# Patient Record
Sex: Male | Born: 1990 | Hispanic: No | Marital: Married | Smoking: Former smoker
Health system: Southern US, Community
[De-identification: ages and names within clinical notes are randomized; demographics above are authoritative.]

## PROBLEM LIST (undated history)

## (undated) DIAGNOSIS — B019 Varicella without complication: Secondary | ICD-10-CM

## (undated) DIAGNOSIS — F419 Anxiety disorder, unspecified: Secondary | ICD-10-CM

## (undated) DIAGNOSIS — E785 Hyperlipidemia, unspecified: Secondary | ICD-10-CM

## (undated) DIAGNOSIS — G43909 Migraine, unspecified, not intractable, without status migrainosus: Secondary | ICD-10-CM

## (undated) HISTORY — DX: Anxiety disorder, unspecified: F41.9

## (undated) HISTORY — PX: MULTIPLE TOOTH EXTRACTIONS: SHX2053

## (undated) HISTORY — DX: Migraine, unspecified, not intractable, without status migrainosus: G43.909

## (undated) HISTORY — DX: Hyperlipidemia, unspecified: E78.5

## (undated) HISTORY — DX: Varicella without complication: B01.9

---
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
## 2010-02-28 ENCOUNTER — Emergency Department (HOSPITAL_COMMUNITY)
Admission: EM | Admit: 2010-02-28 | Discharge: 2010-02-28 | Payer: Self-pay | Source: Home / Self Care | Admitting: Emergency Medicine

## 2010-04-27 LAB — URINALYSIS, ROUTINE W REFLEX MICROSCOPIC
Bilirubin Urine: NEGATIVE
Glucose, UA: NEGATIVE mg/dL
Hgb urine dipstick: NEGATIVE
Ketones, ur: NEGATIVE mg/dL
Nitrite: NEGATIVE
Protein, ur: NEGATIVE mg/dL
Specific Gravity, Urine: 1.02 (ref 1.005–1.030)
Urobilinogen, UA: 1 mg/dL (ref 0.0–1.0)
pH: 6.5 (ref 5.0–8.0)

## 2010-07-30 ENCOUNTER — Emergency Department (HOSPITAL_COMMUNITY)
Admission: EM | Admit: 2010-07-30 | Discharge: 2010-07-30 | Disposition: A | Discharge status: Home / Self Care | Payer: No Typology Code available for payment source | Attending: Emergency Medicine | Admitting: Emergency Medicine

## 2010-07-30 ENCOUNTER — Emergency Department (HOSPITAL_COMMUNITY): Payer: No Typology Code available for payment source

## 2010-07-30 DIAGNOSIS — M25519 Pain in unspecified shoulder: Secondary | ICD-10-CM | POA: Insufficient documentation

## 2010-07-30 DIAGNOSIS — Y92410 Unspecified street and highway as the place of occurrence of the external cause: Secondary | ICD-10-CM | POA: Insufficient documentation

## 2010-07-30 DIAGNOSIS — R071 Chest pain on breathing: Secondary | ICD-10-CM | POA: Insufficient documentation

## 2011-01-09 ENCOUNTER — Emergency Department (INDEPENDENT_AMBULATORY_CARE_PROVIDER_SITE_OTHER)
Admission: EM | Admit: 2011-01-09 | Discharge: 2011-01-09 | Disposition: A | Discharge status: Home / Self Care | Payer: Self-pay | Source: Home / Self Care | Attending: Emergency Medicine | Admitting: Emergency Medicine

## 2011-01-09 ENCOUNTER — Encounter: Payer: Self-pay | Admitting: Emergency Medicine

## 2011-01-09 DIAGNOSIS — J069 Acute upper respiratory infection, unspecified: Secondary | ICD-10-CM

## 2011-01-09 MED ORDER — GUAIFENESIN-CODEINE 100-10 MG/5ML PO SYRP: 5.0000 mL | ORAL_SOLUTION | Freq: Three times a day (TID) | ORAL | Status: AC | PRN

## 2011-01-09 NOTE — ED Notes (Signed)
C/o cough, general aches, sore throat, sore chest, no ear pain.  Denies diarrhea, one episode of vomiting several days ago.

## 2011-01-09 NOTE — ED Provider Notes (Signed)
History     CSN: [id number] Arrival date & time: 01/09/2011  5:14 PM   First MD Initiated Contact with Patient 01/09/11 1604      Chief Complaint  Patient presents with  . URI    (Consider location/radiation/quality/duration/timing/severity/associated sxs/prior treatment) HPI Comments: Coughing and congested, "my burns when I have a fever" "iot kind of hurts (points to both forearms)  Patient is a 20 y.o. male presenting with URI. The history is provided by the patient.  URI The primary symptoms include fever, fatigue, sore throat, cough, vomiting and myalgias. Primary symptoms do not include ear pain, wheezing, abdominal pain, arthralgias or rash. The current episode started 2 days ago. This is a new problem.  The onset of the illness is associated with exposure to sick contacts. Symptoms associated with the illness include chills, congestion and rhinorrhea. The illness is not associated with facial pain or sinus pressure. Risk factors for severe complications from URI include chronic cardiac disease, chronic kidney disease, immunosuppression and diabetes mellitus.    History reviewed. No pertinent past medical history.  History reviewed. No pertinent past surgical history.  History reviewed. No pertinent family history.  History  Substance Use Topics  . Smoking status: Former Smoker -- 1.0 packs/day    Quit date: 01/06/2011  . Smokeless tobacco: Not on file  . Alcohol Use: Yes      Review of Systems  Constitutional: Positive for fever, chills and fatigue.  HENT: Positive for congestion, sore throat and rhinorrhea. Negative for ear pain and sinus pressure.   Respiratory: Positive for cough. Negative for wheezing.   Gastrointestinal: Positive for vomiting. Negative for abdominal pain.  Musculoskeletal: Positive for myalgias. Negative for arthralgias.  Skin: Negative for rash.    Allergies  Review of patient's allergies indicates no known allergies.  Home Medications    Current Outpatient Rx  Name Route Sig Dispense Refill  . ACETAMINOPHEN 500 MG PO TABS Oral Take 500 mg by mouth every 6 (six) hours as needed.      Ronney Asters WARMING COLD MULTI-SYM PO Oral Take by mouth.        BP 131/68  Pulse 97  Temp(Src) 99.5 F (37.5 C) (Oral)  Resp 18  SpO2 97%  Physical Exam  Nursing note and vitals reviewed. Constitutional: He is oriented to person, place, and time. He appears well-developed and well-nourished. No distress.  HENT:  Head: Normocephalic.  Mouth/Throat: Oropharynx is clear and moist and mucous membranes are normal.  Eyes: Pupils are equal, round, and reactive to light. No scleral icterus.  Neck: Normal range of motion.  Cardiovascular: Normal rate.   Pulmonary/Chest: Effort normal and breath sounds normal. Not tachypneic. He has no decreased breath sounds. He has no wheezes. He has no rales. He exhibits no tenderness.  Musculoskeletal: Normal range of motion.  Lymphadenopathy:    He has no cervical adenopathy.  Neurological: He is alert and oriented to person, place, and time. He has normal strength. He displays no tremor. No cranial nerve deficit. He exhibits normal muscle tone. Coordination normal.  Skin: Skin is warm. No rash noted. No erythema.    ED Course  Procedures (including critical care time)  Labs Reviewed - No data to display No results found.   No diagnosis found.    MDM  URI- symptoms-         Jimmie Molly, MD 01/09/11 1744

## 2011-06-06 ENCOUNTER — Encounter (HOSPITAL_COMMUNITY): Payer: Self-pay

## 2011-06-06 ENCOUNTER — Emergency Department (INDEPENDENT_AMBULATORY_CARE_PROVIDER_SITE_OTHER)
Admission: EM | Admit: 2011-06-06 | Discharge: 2011-06-06 | Disposition: A | Discharge status: Home / Self Care | Payer: BC Managed Care – PPO | Source: Home / Self Care | Attending: Family Medicine | Admitting: Family Medicine

## 2011-06-06 DIAGNOSIS — K047 Periapical abscess without sinus: Secondary | ICD-10-CM

## 2011-06-06 MED ORDER — IBUPROFEN 800 MG PO TABS: 800.0000 mg | ORAL_TABLET | Freq: Three times a day (TID) | ORAL | Status: AC

## 2011-06-06 MED ORDER — CLINDAMYCIN HCL 150 MG PO CAPS: 150.0000 mg | ORAL_CAPSULE | Freq: Three times a day (TID) | ORAL | Status: AC

## 2011-06-06 NOTE — ED Provider Notes (Signed)
History     CSN: [id number]  Arrival date & time 06/06/11  1757   First MD Initiated Contact with Patient 06/06/11 1759      Chief Complaint  Patient presents with  . Oral Swelling    (Consider location/radiation/quality/duration/timing/severity/associated sxs/prior treatment) Patient is a 21 y.o. male presenting with tooth pain. The history is provided by the patient.  Dental PainThe primary symptoms include mouth pain. The symptoms began 2 days ago. The symptoms are worsening. The symptoms are new. The symptoms occur constantly.  Additional symptoms include: gum swelling and facial swelling.    History reviewed. No pertinent past medical history.  History reviewed. No pertinent past surgical history.  History reviewed. No pertinent family history.  History  Substance Use Topics  . Smoking status: Current Everyday Smoker -- 1.0 packs/day    Last Attempt to Quit: 01/06/2011  . Smokeless tobacco: Not on file  . Alcohol Use: Yes      Review of Systems  Constitutional: Negative.   HENT: Positive for facial swelling, neck pain and dental problem.   Hematological: Positive for adenopathy.    Allergies  Review of patient's allergies indicates no known allergies.  Home Medications   Current Outpatient Rx  Name Route Sig Dispense Refill  . ACETAMINOPHEN 500 MG PO TABS Oral Take 500 mg by mouth every 6 (six) hours as needed.      Marland Kitchen CLINDAMYCIN HCL 150 MG PO CAPS Oral Take 1 capsule (150 mg total) by mouth 3 (three) times daily. 21 capsule 0  . IBUPROFEN 800 MG PO TABS Oral Take 1 tablet (800 mg total) by mouth 3 (three) times daily. 30 tablet 0    BP 126/74  Pulse 78  Temp(Src) 98 F (36.7 C) (Oral)  Resp 14  SpO2 100%  Physical Exam  Nursing note and vitals reviewed. Constitutional: He is oriented to person, place, and time. He appears well-developed and well-nourished.  HENT:  Head: Normocephalic.  Right Ear: External ear normal.  Left Ear: External ear  normal.  Mouth/Throat:    Lymphadenopathy:    He has cervical adenopathy.  Neurological: He is alert and oriented to person, place, and time.  Skin: Skin is warm and dry.    ED Course  Procedures (including critical care time)  Labs Reviewed - No data to display No results found.   1. Dental abscess       MDM          Linna Hoff, MD 06/06/11 1929

## 2011-06-06 NOTE — Discharge Instructions (Signed)
Take medicine as prescribed, see your dentist as soon as possible °

## 2011-06-06 NOTE — ED Notes (Signed)
States he has a bad tooth (wisdom ) coming in , and it is making his face swell

## 2013-02-24 ENCOUNTER — Emergency Department (HOSPITAL_COMMUNITY)
Admission: EM | Admit: 2013-02-24 | Discharge: 2013-02-24 | Disposition: A | Discharge status: Home / Self Care | Payer: No Typology Code available for payment source | Attending: Emergency Medicine | Admitting: Emergency Medicine

## 2013-02-24 ENCOUNTER — Emergency Department (HOSPITAL_COMMUNITY): Payer: No Typology Code available for payment source

## 2013-02-24 ENCOUNTER — Encounter (HOSPITAL_COMMUNITY): Payer: Self-pay | Admitting: Emergency Medicine

## 2013-02-24 DIAGNOSIS — S39012A Strain of muscle, fascia and tendon of lower back, initial encounter: Secondary | ICD-10-CM

## 2013-02-24 DIAGNOSIS — Y92410 Unspecified street and highway as the place of occurrence of the external cause: Secondary | ICD-10-CM | POA: Insufficient documentation

## 2013-02-24 DIAGNOSIS — Z79899 Other long term (current) drug therapy: Secondary | ICD-10-CM | POA: Insufficient documentation

## 2013-02-24 DIAGNOSIS — S335XXA Sprain of ligaments of lumbar spine, initial encounter: Secondary | ICD-10-CM | POA: Insufficient documentation

## 2013-02-24 DIAGNOSIS — F17200 Nicotine dependence, unspecified, uncomplicated: Secondary | ICD-10-CM | POA: Insufficient documentation

## 2013-02-24 DIAGNOSIS — Y9389 Activity, other specified: Secondary | ICD-10-CM | POA: Insufficient documentation

## 2013-02-24 MED ORDER — NAPROXEN 375 MG PO TABS: 375.0000 mg | ORAL_TABLET | Freq: Two times a day (BID) | ORAL | Status: DC

## 2013-02-24 MED ORDER — METHOCARBAMOL 500 MG PO TABS: 500.0000 mg | ORAL_TABLET | Freq: Two times a day (BID) | ORAL | Status: DC
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
## 2013-02-24 NOTE — Discharge Instructions (Signed)
Please call and set-up an appointment with orthopedics and neurosurgery Please rest and stay hydrated Please as an outpatient obtain a MRI of your thoracic spine based on xray imaging There were no broken bones or dislocations noted to your mid-back and lower back xray  Please take medications as prescribed and on a full stomach  Please apply warm compressions, please massage with icy hot ointment Please rest and stay hydrated Please continue to monitor symptoms closely and if symptoms are to worsen or change (fever greater than 101, chills, chest pain, neck pain, numbness, tingling, inability to control urine or bowel movements, weakness, inability to walk, worsening or changes to pain pattern, fall, injury) please report back to emergency department immediately   Back Pain, Adult Back pain is very common. The pain often gets better over time. The cause of back pain is usually not dangerous. Most people can learn to manage their back pain on their own.  HOME CARE   Stay active. Start with short walks on flat ground if you can. Try to walk farther each day.  Do not sit, drive, or stand in one place for more than 30 minutes. Do not stay in bed.  Do not avoid exercise or work. Activity can help your back heal faster.  Be careful when you bend or lift an object. Bend at your knees, keep the object close to you, and do not twist.  Sleep on a firm mattress. Lie on your side, and bend your knees. If you lie on your back, put a pillow under your knees.  Only take medicines as told by your doctor.  Put ice on the injured area.  Put ice in a plastic bag.  Place a towel between your skin and the bag.  Leave the ice on for 15-20 minutes, 03-04 times a day for the first 2 to 3 days. After that, you can switch between ice and heat packs.  Ask your doctor about back exercises or massage.  Avoid feeling anxious or stressed. Find good ways to deal with stress, such as exercise. GET HELP RIGHT  AWAY IF:   Your pain does not go away with rest or medicine.  Your pain does not go away in 1 week.  You have new problems.  You do not feel well.  The pain spreads into your legs.  You cannot control when you poop (bowel movement) or pee (urinate).  Your arms or legs feel weak or lose feeling (numbness).  You feel sick to your stomach (nauseous) or throw up (vomit).  You have belly (abdominal) pain.  You feel like you may pass out (faint). MAKE SURE YOU:   Understand these instructions.  Will watch your condition.  Will get help right away if you are not doing well or get worse. Document Released: 07/18/2007 Document Revised: 04/23/2011 Document Reviewed: 06/19/2010 [facility] Patient Information 2014 [place], [place].  Back Exercises These exercises may help you when beginning to rehabilitate your injury. Your symptoms may resolve with or without further involvement from your physician, physical therapist or athletic trainer. While completing these exercises, remember:   Restoring tissue flexibility helps normal motion to return to the joints. This allows healthier, less painful movement and activity.  An effective stretch should be held for at least 30 seconds.  A stretch should never be painful. You should only feel a gentle lengthening or release in the stretched tissue. STRETCH  Extension, Prone on Elbows   Lie on your stomach on the floor, a bed  will be too soft. Place your palms about shoulder width apart and at the height of your head.  Place your elbows under your shoulders. If this is too painful, stack pillows under your chest.  Allow your body to relax so that your hips drop lower and make contact more completely with the floor.  Hold this position for __________ seconds.  Slowly return to lying flat on the floor. Repeat __________ times. Complete this exercise __________ times per day.  RANGE OF MOTION  Extension, Prone Press Ups   Lie on your stomach  on the floor, a bed will be too soft. Place your palms about shoulder width apart and at the height of your head.  Keeping your back as relaxed as possible, slowly straighten your elbows while keeping your hips on the floor. You may adjust the placement of your hands to maximize your comfort. As you gain motion, your hands will come more underneath your shoulders.  Hold this position __________ seconds.  Slowly return to lying flat on the floor. Repeat __________ times. Complete this exercise __________ times per day.  RANGE OF MOTION- Quadruped, Neutral Spine   Assume a hands and knees position on a firm surface. Keep your hands under your shoulders and your knees under your hips. You may place padding under your knees for comfort.  Drop your head and point your tail bone toward the ground below you. This will round out your low back like an angry cat. Hold this position for __________ seconds.  Slowly lift your head and release your tail bone so that your back sags into a large arch, like an old horse.  Hold this position for __________ seconds.  Repeat this until you feel limber in your low back.  Now, find your "sweet spot." This will be the most comfortable position somewhere between the two previous positions. This is your neutral spine. Once you have found this position, tense your stomach muscles to support your low back.  Hold this position for __________ seconds. Repeat __________ times. Complete this exercise __________ times per day.  STRETCH  Flexion, Single Knee to Chest   Lie on a firm bed or floor with both legs extended in front of you.  Keeping one leg in contact with the floor, bring your opposite knee to your chest. Hold your leg in place by either grabbing behind your thigh or at your knee.  Pull until you feel a gentle stretch in your low back. Hold __________ seconds.  Slowly release your grasp and repeat the exercise with the opposite side. Repeat __________  times. Complete this exercise __________ times per day.  STRETCH - Hamstrings, Standing  Stand or sit and extend your right / left leg, placing your foot on a chair or foot stool  Keeping a slight arch in your low back and your hips straight forward.  Lead with your chest and lean forward at the waist until you feel a gentle stretch in the back of your right / left knee or thigh. (When done correctly, this exercise requires leaning only a small distance.)  Hold this position for __________ seconds. Repeat __________ times. Complete this stretch __________ times per day. STRENGTHENING  Deep Abdominals, Pelvic Tilt   Lie on a firm bed or floor. Keeping your legs in front of you, bend your knees so they are both pointed toward the ceiling and your feet are flat on the floor.  Tense your lower abdominal muscles to press your low back into the  floor. This motion will rotate your pelvis so that your tail bone is scooping upwards rather than pointing at your feet or into the floor.  With a gentle tension and even breathing, hold this position for __________ seconds. Repeat __________ times. Complete this exercise __________ times per day.  STRENGTHENING  Abdominals, Crunches   Lie on a firm bed or floor. Keeping your legs in front of you, bend your knees so they are both pointed toward the ceiling and your feet are flat on the floor. Cross your arms over your chest.  Slightly tip your chin down without bending your neck.  Tense your abdominals and slowly lift your trunk high enough to just clear your shoulder blades. Lifting higher can put excessive stress on the low back and does not further strengthen your abdominal muscles.  Control your return to the starting position. Repeat __________ times. Complete this exercise __________ times per day.  STRENGTHENING  Quadruped, Opposite UE/LE Lift   Assume a hands and knees position on a firm surface. Keep your hands under your shoulders and your  knees under your hips. You may place padding under your knees for comfort.  Find your neutral spine and gently tense your abdominal muscles so that you can maintain this position. Your shoulders and hips should form a rectangle that is parallel with the floor and is not twisted.  Keeping your trunk steady, lift your right hand no higher than your shoulder and then your left leg no higher than your hip. Make sure you are not holding your breath. Hold this position __________ seconds.  Continuing to keep your abdominal muscles tense and your back steady, slowly return to your starting position. Repeat with the opposite arm and leg. Repeat __________ times. Complete this exercise __________ times per day. Document Released: 02/16/2005 Document Revised: 04/23/2011 Document Reviewed: 05/13/2008 [facility] Patient Information 2014 [place], [place].   Emergency Department Resource Guide 1) Find a Doctor and Pay Out of Pocket Although you won't have to find out who is covered by your insurance plan, it is a good idea to ask around and get recommendations. You will then need to call the office and see if the doctor you have chosen will accept you as a new patient and what types of options they offer for patients who are self-pay. Some doctors offer discounts or will set up payment plans for their patients who do not have insurance, but you will need to ask so you aren't surprised when you get to your appointment.  2) Contact Your Local Health Department Not all health departments have doctors that can see patients for sick visits, but many do, so it is worth a call to see if yours does. If you don't know where your local health department is, you can check in your phone book. The CDC also has a tool to help you locate your state's health department, and many state websites also have listings of all of their local health departments.  3) Find a Walk-in Clinic If your illness is not likely to be very severe or  complicated, you may want to try a walk in clinic. These are popping up all over the country in pharmacies, drugstores, and shopping centers. They're usually staffed by nurse practitioners or physician assistants that have been trained to treat common illnesses and complaints. They're usually fairly quick and inexpensive. However, if you have serious medical issues or chronic medical problems, these are probably not your best option.  No Primary Care Doctor: -  Call Health Connect at  [phone] - they can help you locate a primary care doctor that  accepts your insurance, provides certain services, etc. - Physician Referral Service- [phone]  Chronic Pain Problems: Organization         Address  Phone   Notes  [facility]  [phone] Patients need to be referred by their primary care doctor.   Medication Assistance: Organization         Address  Phone   Notes  [facility] Medication [facility] [address] [place], [place] [address] [phone] --Must be a resident of [facility] -- Must have NO insurance coverage whatsoever (no Medicaid/ Medicare, etc.) -- The pt. MUST have a primary care doctor that directs their care regularly and follows them in the community   [facility]  [phone]   [employer]  [phone]    Agencies that provide inexpensive medical care: Organization         Address  Phone   Notes  [facility]  [phone]   [facility]    [phone]   [facility] [address] [place], [place] [address] [phone]   [facility] [address], [place] [phone]   Planned Parenthood    [phone]   [facility]    [phone]   [facility]  [address], [place] Phone:  [phone], Fax:  [phone] Hours of Operation:  9 am - 6 pm, M-F.  Also accepts  Medicaid/Medicare and self-pay.  [facility]  [address], [place] Phone: [phone], Fax: [phone]. Hours of Operation:  8:30 am - 5:30 pm, M-F.  Also accepts Medicaid and self-pay.  [facility] [place] [address], [place] Phone: [phone]   [facility] [address] Natasha Bence [place], [place] [phone], Ext. [phone] Mondays & Thursdays: 7-9 AM.  First 15 patients are seen on a first come, first serve basis.    Medicaid-accepting [facility] Providers:  Organization         Address  Phone   Notes  [facility] [address], [place] [phone] Also accepts self-pay patients.  [facility] [address] Laurell Josephs [place], [place]  [phone]   [facility] [address], [place] [phone]   [facility] [address] [place] [phone]   Renaye Rakers [address], [place]   [phone] Only accepts [place] patients after they have their name applied to their card.   Self-Pay (no insurance) in [facility]:  Organization         Address  Phone   Notes  Sickle Cell Patients, [facility] [address] [place], [place] [phone]   [facility] [address] [place], [place] [phone]   [facility] [place]  [address], [place] [phone]   [facility]/Dr. Osei-Bonsu  [address] [place] or [address], [place] [phone] Phone number for both [place] and [place] locations is the same.  [facility] [address], [place] Otto [phone]   [facility] [phone]  [address] [place] or [address] [phone]   [facility] [address] [place], [place] [phone], phone; [phone], fax Sees patients 1st and 3rd Saturday of every month.  Must not qualify for public or private insurance (i.e. Medicaid, Medicare, [place] Health Choice, Veterans' Benefits)  Household income should be no more than 200% of the poverty level The clinic cannot treat you if you are pregnant or think you are pregnant  Sexually transmitted diseases are not treated at the clinic.    Dental Care: Organization         Address  Phone  Notes  [facility] Department of [facility] [address] [place], [place] [phone] Accepts children up to age 40 who are enrolled in [place] or [place] Health Choice; pregnant women with a Medicaid card; and children who have applied for Medicaid or [place] Health Choice, but were declined, whose parents can pay a reduced fee at time of service.  [facility]  [address], [place] [phone] Accepts children up to age 64 who are enrolled in [place] or [place] Health Choice; pregnant women with a Medicaid card; and children who have applied for Medicaid or [place] Health Choice, but were declined, whose parents can pay a reduced fee at time of service.  [facility]  [address] [place], [place] [phone] Patients are seen by appointment only. Walk-ins are not accepted. [facility] will see patients 72 years of age and older. Monday - Tuesday (8am-5pm) Most Wednesdays (8:30-5pm) $30 per visit, cash only  [facility] Adult Dental Access PRO[facility]  [address], [facility] [phone] Patients are seen by appointment only. Walk-ins are not accepted. [facility] will see patients 63 years of age and older. One Wednesday Evening (Monthly: Volunteer Based).  $30 per visit, cash only  [employer]  [phone] for adults; Children under age 42, call Graduate Pediatric Dentistry at [phone]. Children aged  39-14, please call [phone] to request a pediatric application.  Dental services are provided in all areas of dental care including fillings, crowns and bridges, complete and partial dentures, implants, gum treatment, root canals, and extractions. Preventive care is also provided. Treatment is provided to both adults and children. Patients are selected via a lottery and there is often a waiting list.   [facility] [address] [place]  [phone] [email or web address]   [facility] [address] [place], [place] [phone], Ext. [phone] Second and Fourth Thursday of each month, opens at 6:30 AM; Clinic ends at 9 AM.  Patients are seen on a first-come first-served basis, and a limited number are seen during each clinic.   [facility]  [address] Ether Griffins [place], [place] [phone]   Eligibility Requirements You must have lived in [place], [place], or [place] counties for at least the last three months.   You cannot be eligible for state or federal sponsored [place], including [employer], [place], or [employer].   You generally cannot be eligible for healthcare insurance through your employer.    How to apply: Eligibility screenings are held every Tuesday and Wednesday afternoon from 1:00 pm until 4:00 pm. You do not need an appointment for the interview!  [facility] [address], [place], [place] [phone]   [facility]  [phone]   [facility]  [phone]   [facility]  [phone]    Behavioral Health Resources in the Community: Intensive Outpatient Programs Organization         Address  Phone  Notes  [facility] [address], [place], [place] [phone]   [facility] [address], [place], [place] [phone]   [facility] [address],  [place], [place]  [phone]   [facility] [address],  [place], [place] [phone] or [phone]   Substance Abuse Resources Organization         Address  Phone  Notes  [facility]  [phone]   [facility]  [phone]   [facility]  [phone]   [facility]  [phone]   Residential & Outpatient Substance Abuse Program  [phone]   Psychological Services Organization         Address  Phone  Notes  [facility]  [phone]   [facility]  [phone]- [phone]   [facility] [address], [place] [phone] or [phone]    Mobile Crisis Teams Organization         Address  Phone  Notes  [facility], Mobile Crisis Care Unit  [phone]   [facility]  [address] [place], [place] [phone]   Doristine LocksSharon DeEsch [address] [place] [phone]    Self-Help/Support Groups Organization         Address  Phone             Notes  [facility] [place] - variety of support groups  336- [phone] Call for more information  Narcotics Anonymous (NA), [facility] [address], [place]   2 meetings at this location   Statisticianesidential Treatment Programs Organization         Address  Phone  Notes  [facility] [address],    [place]  [phone]   [facility]  [address], [place] [phone], [place], [place] [phone]   [facility] [address], [place] [phone] Admissions: 8am-3pm M-F  [facility] [address]    [place], [place] [phone]   [facility] [address], [place], [place] [phone]   [facility] [address]  [place], [place] [phone]   [facility] - Intensive Outpatient [address], [place], [place] [phone]   [facility]  (Addiction Recovery [facility] [address]  [place], [place] [phone] or [phone]   [facility] ([facility]) [address] [place], [place] [phone] Accepts Medicaid  [facility] [address]  [place] [phone] Substance Abuse/Addiction Treatment   [facility] Resources Organization         Address  Phone  Notes  [facility]  [phone]   Angie FavaJulie Brannon, PhD [address] [place], [place]   [phone] or [phone]   [facility]   [address] [place], [place] [phone]   [facility] [address], [place], [place] [phone] Insurance/Medicaid/sponsorship through [employer] [facility]  [facility] Families [address]  [place], [place] [phone] [place], [place] [phone]    Dr. Adele Schilder  [phone]   [facility] Dept. 1) [address] [place] 2) [place] 3)  [address], [place] [phone]  [phone]   [place] [phone] or [phone] (After Hours)

## 2013-02-27 NOTE — ED Provider Notes (Signed)
Medical screening examination/treatment/procedure(s) were performed by non-physician practitioner and as supervising physician I was immediately available for consultation/collaboration.  EKG Interpretation   None         Deairra Halleck T Delmo Matty, MD 02/27/13 0749 

## 2013-06-19 ENCOUNTER — Telehealth: Payer: Self-pay

## 2013-06-19 NOTE — Telephone Encounter (Signed)
Medication List and allergies:  Updated and Reviewed  90 day supply/mail order: n/a Local prescriptions:  [facility] [id number] [place] - [address]  Immunization due:  See below  A/P: Personal, family and PSH- Reviewed and Updated Flu- did not receive Tdap- unsure   NEW PATIENT [facility] PCP- [facility] Patient encouraged to have previous records faxed here.     To discuss with provider: Nothing at this time.

## 2013-06-22 ENCOUNTER — Ambulatory Visit: Payer: BC Managed Care – PPO | Admitting: Family Medicine

## 2013-06-22 DIAGNOSIS — Z0289 Encounter for other administrative examinations: Secondary | ICD-10-CM

## 2013-09-18 ENCOUNTER — Ambulatory Visit: Payer: BC Managed Care – PPO | Admitting: Family Medicine

## 2013-09-18 DIAGNOSIS — Z0289 Encounter for other administrative examinations: Secondary | ICD-10-CM

## 2015-02-14 ENCOUNTER — Encounter (HOSPITAL_COMMUNITY): Payer: Self-pay | Admitting: *Deleted

## 2015-02-14 ENCOUNTER — Emergency Department (HOSPITAL_COMMUNITY)
Admission: EM | Admit: 2015-02-14 | Discharge: 2015-02-14 | Disposition: A | Discharge status: Home / Self Care | Payer: BLUE CROSS/BLUE SHIELD | Source: Home / Self Care | Attending: Family Medicine | Admitting: Family Medicine

## 2015-02-14 DIAGNOSIS — N644 Mastodynia: Secondary | ICD-10-CM | POA: Diagnosis not present

## 2015-02-14 NOTE — Discharge Instructions (Signed)
Use heat, advil and reduce caffeine.

## 2015-02-14 NOTE — ED Notes (Signed)
Pt      Reports     Chest   Pain    X   1  Week         denys  Any  Shortness   Of  Breath         Skin  Is  Warm  And  Dry  Cap  Refill  Is  Intact     Pt  Sitting  Upright  On the  Exam   Table   speakingb in  Complete  sentances

## 2015-02-14 NOTE — ED Provider Notes (Signed)
CSN: [id number]     Arrival date & time 02/14/15  1735 History   First MD Initiated Contact with Patient 02/14/15 1759     Chief Complaint  Patient presents with  . Chest Pain   (Consider location/radiation/quality/duration/timing/severity/associated sxs/prior Treatment) Patient is a 25 y.o. male presenting with chest pain. The history is provided by the patient.  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 Relieved by:  None tried Worsened by:  Nothing tried Associated symptoms: no cough, no fever, no nausea, no palpitations and no shortness of breath   Associated symptoms comment:  Local soreness to breasts bilat.   Past Medical History  Diagnosis Date  . Anxiety    Past Surgical History  Procedure Laterality Date  . Multiple tooth extractions     Family History  Problem Relation Age of Onset  . Diabetes      mother side of family  . Heart disease Brother   . [name] Parkinson White syndrome Brother    Social History  Substance Use Topics  . Smoking status: Current Every Day Smoker -- 1.00 packs/day    Last Attempt to Quit: 01/06/2011  . Smokeless tobacco: None  . Alcohol Use: Yes    Review of Systems  Constitutional: Negative.  Negative for fever.  HENT: Negative.   Respiratory: Negative for cough, chest tightness and shortness of breath.   Cardiovascular: Positive for chest pain. Negative for palpitations and leg swelling.  Gastrointestinal: Negative for nausea.  All other systems reviewed and are negative.   Allergies  Shellfish allergy  Home Medications   Prior to Admission medications   Medication Sig Start Date End Date Taking? Authorizing Provider  ibuprofen (ADVIL,MOTRIN) 200 MG tablet Take 800 mg by mouth every 6 (six) hours as needed for headache or mild pain.    Historical Provider,  MD  methocarbamol (ROBAXIN) 500 MG tablet Take 1 tablet (500 mg total) by mouth 2 (two) times daily. 02/24/13   Marissa Sciacca, PA-C  naproxen (NAPROSYN) 375 MG tablet Take 1 tablet (375 mg total) by mouth 2 (two) times daily. 02/24/13   Marissa Sciacca, PA-C   Meds Ordered and Administered this Visit  Medications - No data to display  BP 158/86 mmHg  Pulse 78  Temp(Src) 98.6 F (37 C) (Oral)  Resp 18  SpO2 100% No data found.   Physical Exam  Constitutional: He is oriented to person, place, and time. He appears well-developed and well-nourished.  Neck: Normal range of motion. Neck supple.  Cardiovascular: Normal heart sounds and intact distal pulses.   Pulmonary/Chest: Effort normal and breath sounds normal. He exhibits tenderness.  bilat breast soreness.  Neurological: He is alert and oriented to person, place, and time.  Skin: Skin is warm and dry.  Nursing note and vitals reviewed.   ED Course  Procedures (including critical care time)  Labs Review Labs Reviewed - No data to display  Imaging Review No results found.   Visual Acuity Review  Right Eye Distance:   Left Eye Distance:   Bilateral Distance:    Right Eye Near:   Left Eye Near:    Bilateral Near:     ED ECG REPORT   Date: 02/14/2015  Rate: 92  Rhythm: normal sinus rhythm  QRS Axis:right  Intervals: normal  ST/T Wave abnormalities: nonspecific T wave changes  Conduction Disutrbances:none  Narrative Interpretation:   Old EKG Reviewed: none available  I have personally reviewed the EKG tracing and agree with the computerized printout as noted.     MDM   1. Mastalgia        Linna HoffJames D Kindl, MD 02/17/15 2038

## 2015-06-02 ENCOUNTER — Ambulatory Visit (INDEPENDENT_AMBULATORY_CARE_PROVIDER_SITE_OTHER): Payer: BLUE CROSS/BLUE SHIELD | Admitting: Family

## 2015-06-02 ENCOUNTER — Telehealth: Payer: Self-pay | Admitting: Family

## 2015-06-02 ENCOUNTER — Encounter: Payer: Self-pay | Admitting: Family

## 2015-06-02 VITALS — BP 140/82 | HR 82 | Temp 98.2°F | Resp 16 | Ht 72.0 in | Wt 307.0 lb

## 2015-06-02 DIAGNOSIS — N62 Hypertrophy of breast: Secondary | ICD-10-CM | POA: Insufficient documentation

## 2015-06-02 DIAGNOSIS — N644 Mastodynia: Secondary | ICD-10-CM

## 2015-06-02 MED ORDER — IBUPROFEN 800 MG PO TABS: 800.0000 mg | ORAL_TABLET | Freq: Three times a day (TID) | ORAL | Status: DC | PRN

## 2015-06-02 NOTE — Assessment & Plan Note (Signed)
Most likely related to gynecomastia with work up in progress. Start Ibuprofen as needed for discomfort. Follow up pending gynecomastia work up.

## 2015-06-02 NOTE — Progress Notes (Signed)
Subjective:    Patient ID: Dale Harrell, male    DOB: 09-Mar-1990, 25 y.o.   MRN: [id number]  Chief Complaint  Patient presents with  . Establish Care    has been having swelling in his chest where his breasts are, x2 months, really tender to touch    HPI:  Dale Harrell is a 25 y.o. male who  has a past medical history of Anxiety; Chicken pox; Hyperlipidemia; and Migraine. and presents today for an office visit to establish care.  1.) Mastalgia - This is a new problem. Associated symptom of pain located on his chest and is sensitive to the touch when pressed. Described as throbbing on occasion with some burning when he experiences heart. This has been going on for about 2 months. Previously seen in Urgent Care and diagnosed with mastalgia and prescribed anti-inflammatories which he could not find over the counter. Course of the symptoms has improved some. The symptoms generally come and go. No significant trauma that he can recall. Previous workup included an EKG which was normal.   Allergies  Allergen Reactions  . Shellfish Allergy Itching    Throat itching     Outpatient Prescriptions Prior to Visit  Medication Sig Dispense Refill  . ibuprofen (ADVIL,MOTRIN) 200 MG tablet Take 800 mg by mouth every 6 (six) hours as needed for headache or mild pain.    . methocarbamol (ROBAXIN) 500 MG tablet Take 1 tablet (500 mg total) by mouth 2 (two) times daily. 20 tablet 0  . naproxen (NAPROSYN) 375 MG tablet Take 1 tablet (375 mg total) by mouth 2 (two) times daily. 20 tablet 0   No facility-administered medications prior to visit.     Past Medical History  Diagnosis Date  . Anxiety   . Chicken pox   . Hyperlipidemia   . Migraine     Not since 25 years of age.     Past Surgical History  Procedure Laterality Date  . Multiple tooth extractions       Family History  Problem Relation Age of Onset  . Diabetes      mother side of family  . Heart disease Brother   .  [name] Parkinson White syndrome Brother   . Healthy Mother   . Healthy Father      Social History   Social History  . Marital Status: Single    Spouse Name: N/A  . Number of Children: 0  . 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 Smoking status: Current Every Day Smoker -- 1.00 packs/day    Types: Cigars    Last Attempt to Quit: 01/06/2011  . Smokeless tobacco: Never Used  . Alcohol Use: 12.6 oz/week    21 Cans of beer per week  . Drug Use: No  . Sexual Activity: Not on file   Other Topics Concern  . Not on file   Social History Narrative   Fun: Play the piano      Review of Systems  Constitutional: Negative for fever and chills.  Respiratory: Negative for chest tightness and shortness of breath.   Cardiovascular: Positive for chest pain. Negative for palpitations and leg swelling.      Objective:    BP 140/82 mmHg  Pulse 82  Temp(Src) 98.2 F (36.8 C) (Oral)  Resp 16  Ht 6' (1.829 m)  Wt 307 lb (139.254 kg)  BMI 41.63 kg/m2  SpO2  98% Nursing note and vital signs reviewed.  Physical Exam  Constitutional: He is oriented to person, place, and time. He appears well-developed and well-nourished. No distress.  Cardiovascular: Normal rate, regular rhythm, normal heart sounds and intact distal pulses.   Pulmonary/Chest: Effort normal and breath sounds normal.  Enlarged breast tissue that is tender to the touch with no signs of inflammation or nipple discharge.   Neurological: He is alert and oriented to person, place, and time.  Skin: Skin is warm and dry.  Psychiatric: He has a normal mood and affect. His behavior is normal. Judgment and thought content normal.       Assessment & Plan:   Problem List Items Addressed This Visit      Other   Mastalgia - Primary    Most likely related to gynecomastia with work up in progress. Start Ibuprofen as needed for discomfort. Follow up pending gynecomastia work  up.       Relevant Medications   ibuprofen (ADVIL,MOTRIN) 800 MG tablet   Other Relevant Orders   TSH   Hepatic function panel   Basic Metabolic Panel (BMET)   FSH   Estradiol   LH   Testosterone   Prolactin   Gynecomastia    Symptoms of mastalgia most likely related to gynecomastia. No masses noted upon physical exam with mild tenderness. Obtain TSH, testosterone, prolactin, denies and hormone, hepatic function panel, follicle-stimulating hormone, estradiol, and basic metabolic panel. Patient states he does have history of low testosterone and is concerned about his sperm count. Follow-up pending blood work results      Relevant Orders   TSH   Hepatic function panel   Basic Metabolic Panel (BMET)   FSH   Estradiol   LH   Testosterone   Prolactin       I have discontinued Mr. Zielinski's ibuprofen, methocarbamol, and naproxen. I am also having him start on ibuprofen.   Meds ordered this encounter  Medications  . ibuprofen (ADVIL,MOTRIN) 800 MG tablet    Sig: Take 1 tablet (800 mg total) by mouth every 8 (eight) hours as needed for moderate pain.    Dispense:  90 tablet    Refill:  0    Order Specific Question:  Supervising Provider    Answer:  Hillard DankerRAWFORD, ELIZABETH A [[id number]]     Follow-up: Return in about 1 month (around 07/02/2015).  Jeanine Luzalone, Gregory, FNP

## 2015-06-02 NOTE — Telephone Encounter (Signed)
Patient is requesting ibuprofen to be sent to [facility] on [address]

## 2015-06-02 NOTE — Assessment & Plan Note (Signed)
Symptoms of mastalgia most likely related to gynecomastia. No masses noted upon physical exam with mild tenderness. Obtain TSH, testosterone, prolactin, denies and hormone, hepatic function panel, follicle-stimulating hormone, estradiol, and basic metabolic panel. Patient states he does have history of low testosterone and is concerned about his sperm count. Follow-up pending blood work results

## 2015-06-02 NOTE — Patient Instructions (Signed)
Thank you for choosing [facility].  Summary/Instructions:  Your prescription(s) have been submitted to your pharmacy or been printed and provided for you. Please take as directed and contact our office if you believe you are having problem(s) with the medication(s) or have any questions.  Please stop by the lab on the basement level of the building for your blood work. Your results will be released to MyChart (or called to you) after review, usually within 72 hours after test completion. If any changes need to be made, you will be notified at that same time.  If your symptoms worsen or fail to improve, please contact our office for further instruction, or in case of emergency go directly to the emergency room at the closest medical facility.     

## 2015-06-02 NOTE — Progress Notes (Signed)
Pre visit review using our clinic review tool, if applicable. No additional management support is needed unless otherwise documented below in the visit note. 

## 2015-06-03 NOTE — Telephone Encounter (Signed)
Rx sent. Pt is aware.  °

## 2015-06-07 ENCOUNTER — Other Ambulatory Visit (INDEPENDENT_AMBULATORY_CARE_PROVIDER_SITE_OTHER): Payer: BLUE CROSS/BLUE SHIELD

## 2015-06-07 DIAGNOSIS — N62 Hypertrophy of breast: Secondary | ICD-10-CM | POA: Diagnosis not present

## 2015-06-07 DIAGNOSIS — N644 Mastodynia: Secondary | ICD-10-CM | POA: Diagnosis not present

## 2015-06-07 LAB — BASIC METABOLIC PANEL
BUN: 13 mg/dL (ref 6–23)
CO2: 29 mEq/L (ref 19–32)
Calcium: 9.7 mg/dL (ref 8.4–10.5)
Chloride: 101 mEq/L (ref 96–112)
Creatinine, Ser: 1.08 mg/dL (ref 0.40–1.50)
GFR: 106.98 mL/min (ref >60.00)
Glucose, Bld: 113 mg/dL — ABNORMAL HIGH (ref 70–99)
Potassium: 4.2 mEq/L (ref 3.5–5.1)
Sodium: 139 mEq/L (ref 135–145)

## 2015-06-07 LAB — HEPATIC FUNCTION PANEL
ALT: 43 U/L (ref 0–53)
AST: 23 U/L (ref 0–37)
Albumin: 4.3 g/dL (ref 3.5–5.2)
Alkaline Phosphatase: 71 U/L (ref 39–117)
Bilirubin, Direct: 0.2 mg/dL (ref 0.0–0.3)
Total Bilirubin: 0.6 mg/dL (ref 0.2–1.2)
Total Protein: 7.3 g/dL (ref 6.0–8.3)

## 2015-06-07 LAB — TSH: TSH: 2.16 u[IU]/mL (ref 0.35–4.50)

## 2015-06-07 LAB — LUTEINIZING HORMONE: LH: 6.25 m[IU]/mL (ref 1.50–9.30)

## 2015-06-07 LAB — TESTOSTERONE: TESTOSTERONE: 313.59 ng/dL (ref 300.00–890.00)

## 2015-06-07 LAB — FOLLICLE STIMULATING HORMONE: FSH: 8.1 m[IU]/mL (ref 1.4–18.1)

## 2015-06-08 LAB — PROLACTIN: Prolactin: 7.3 ng/mL (ref 2.0–18.0)

## 2015-06-08 LAB — ESTRADIOL: Estradiol: 52 pg/mL — ABNORMAL HIGH (ref <=39)

## 2015-06-09 ENCOUNTER — Other Ambulatory Visit (INDEPENDENT_AMBULATORY_CARE_PROVIDER_SITE_OTHER): Payer: BLUE CROSS/BLUE SHIELD

## 2015-06-09 ENCOUNTER — Encounter: Payer: Self-pay | Admitting: Family

## 2015-06-09 ENCOUNTER — Ambulatory Visit (INDEPENDENT_AMBULATORY_CARE_PROVIDER_SITE_OTHER): Payer: BLUE CROSS/BLUE SHIELD | Admitting: Family

## 2015-06-09 VITALS — BP 144/88 | HR 83 | Temp 98.2°F | Resp 16 | Ht 72.0 in | Wt 303.0 lb

## 2015-06-09 DIAGNOSIS — Z0000 Encounter for general adult medical examination without abnormal findings: Secondary | ICD-10-CM | POA: Diagnosis not present

## 2015-06-09 DIAGNOSIS — Z23 Encounter for immunization: Secondary | ICD-10-CM | POA: Diagnosis not present

## 2015-06-09 DIAGNOSIS — Z0001 Encounter for general adult medical examination with abnormal findings: Secondary | ICD-10-CM | POA: Insufficient documentation

## 2015-06-09 LAB — CBC
HEMATOCRIT: 41.8 % (ref 39.0–52.0)
Hemoglobin: 14.1 g/dL (ref 13.0–17.0)
MCHC: 33.6 g/dL (ref 30.0–36.0)
MCV: 80.6 fl (ref 78.0–100.0)
Platelets: 285 10*3/uL (ref 150.0–400.0)
RBC: 5.19 Mil/uL (ref 4.22–5.81)
RDW: 13.4 % (ref 11.5–15.5)
WBC: 7.9 10*3/uL (ref 4.0–10.5)

## 2015-06-09 LAB — HEMOGLOBIN A1C: HEMOGLOBIN A1C: 6.3 % (ref 4.6–6.5)

## 2015-06-09 LAB — LIPID PANEL
CHOLESTEROL: 148 mg/dL (ref 0–200)
HDL: 43.3 mg/dL (ref >39.00)
LDL Cholesterol: 86 mg/dL (ref 0–99)
NONHDL: 104.88
Total CHOL/HDL Ratio: 3
Triglycerides: 92 mg/dL (ref 0.0–149.0)
VLDL: 18.4 mg/dL (ref 0.0–40.0)

## 2015-06-09 NOTE — Assessment & Plan Note (Signed)
1) Anticipatory Guidance: Discussed importance of wearing a seatbelt while driving and not texting while driving; changing batteries in smoke detector at least once annually; wearing suntan lotion when outside; eating a balanced and moderate diet; getting physical activity at least 30 minutes per day.  2) Immunizations / Screenings / Labs:  Tetanus updated today. All other immunizations are up-to-date per recommendations. Obtain HIV antibody for HIV screening. Due for a dental exam encouraged to be completed independently. All other screenings are up-to-date per recommendations. Obtain CBC and Lipid profile.  Overall well exam with risk factors for cardiovascular disease including obesity. Encouraged weight loss of approximately 5-10% of current body weight through lifestyle changes.Recommend increasing physical activity to 30 minutes of moderate level activity daily. Encourage nutritional intake that focuses on nutrient dense foods and is moderate, varied, and balanced and is low in saturated fats and processed/sugary foods. Blood pressure slightly elevated today and we'll continue to monitor at next office visit. Continue other healthy lifestyle behaviors and choices. Follow-up prevention exam in 1 year. Follow-up office visit pending blood work and for previous conditions.

## 2015-06-09 NOTE — Progress Notes (Signed)
Subjective:    Patient ID: Dale Harrell, male    DOB: 06-16-1990, 25 y.o.   MRN: [id number]  Chief Complaint  Patient presents with  . CPE    Fasting,     HPI:  Dale Harrell is a 25 y.o. male who presents today for an annual wellness visit.   1) Health Maintenance -   Diet - Averages about 2-3 meals per day consisting pork, chicken, potatotes, vegetables and occasional fruit. Caffeine intake is minimal  Exercise - No structured exercise; Walking occasional   2) Preventative Exams / Immunizations:  Dental -- Due for exam  Vision -- Up to date   Health Maintenance  Topic Date Due  . HIV Screening  04/02/2005  . TETANUS/TDAP  04/02/2009  . INFLUENZA VACCINE  09/13/2015    Immunization History  Administered Date(s) Administered  . Tdap 06/09/2015   Allergies  Allergen Reactions  . Shellfish Allergy Itching    Throat itching     Outpatient Prescriptions Prior to Visit  Medication Sig Dispense Refill  . ibuprofen (ADVIL,MOTRIN) 800 MG tablet Take 1 tablet (800 mg total) by mouth every 8 (eight) hours as needed for moderate pain. 90 tablet 0   No facility-administered medications prior to visit.     Past Medical History  Diagnosis Date  . Anxiety   . Chicken pox   . Hyperlipidemia   . Migraine     Not since 25 years of age.     Past Surgical History  Procedure Laterality Date  . Multiple tooth extractions       Family History  Problem Relation Age of Onset  . Diabetes      mother side of family  . Heart disease Brother   . [name] Parkinson White syndrome Brother   . Healthy Mother   . Healthy Father      Social History   Social History  . Marital Status: Single    Spouse Name: N/A  . Number of Children: 0  . 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 Smoking status: Current Every Day Smoker -- 1.00 packs/day    Types: Cigars    Last Attempt to Quit: 01/06/2011  .  Smokeless tobacco: Never Used  . Alcohol Use: 12.6 oz/week    21 Cans of beer per week  . Drug Use: No  . Sexual Activity: Not on file   Other Topics Concern  . Not on file   Social History Narrative   Fun: Play the piano    Review of Systems  Constitutional: Denies fever, chills, fatigue, or significant weight gain/loss. HENT: Head: Denies headache or neck pain Ears: Denies changes in hearing, ringing in ears, earache, drainage Nose: Denies discharge, stuffiness, itching, nosebleed, sinus pain Throat: Denies sore throat, hoarseness, dry mouth, sores, thrush Eyes: Denies loss/changes in vision, pain, redness, blurry/double vision, flashing lights Cardiovascular: Denies chest pain/discomfort, tightness, palpitations, shortness of breath with activity, difficulty lying down, swelling, sudden awakening with shortness of breath Respiratory: Denies shortness of breath, cough, sputum production, wheezing Gastrointestinal: Denies dysphasia, heartburn, change in appetite, nausea, change in bowel habits, rectal bleeding, constipation, diarrhea, yellow skin or eyes Genitourinary: Denies frequency, urgency, burning/pain, blood in urine, incontinence, change in urinary strength. Musculoskeletal: Denies muscle/joint pain, stiffness, back pain, redness or swelling of joints, trauma Skin: Denies rashes, lumps, itching, dryness, color changes, or hair/nail changes Neurological: Denies dizziness, fainting, seizures, weakness, numbness,  tingling, tremor Psychiatric - Denies nervousness, stress, depression or memory loss Endocrine: Denies heat or cold intolerance, sweating, frequent urination, excessive thirst, changes in appetite Hematologic: Denies ease of bruising or bleeding     Objective:     BP 144/88 mmHg  Pulse 83  Temp(Src) 98.2 F (36.8 C) (Oral)  Resp 16  Ht 6' (1.829 m)  Wt 303 lb (137.44 kg)  BMI 41.09 kg/m2  SpO2 98% Nursing note and vital signs reviewed.  Physical Exam    Constitutional: He is oriented to person, place, and time. He appears well-developed and well-nourished.  HENT:  Head: Normocephalic.  Right Ear: Hearing, tympanic membrane, external ear and ear canal normal.  Left Ear: Hearing, tympanic membrane, external ear and ear canal normal.  Nose: Nose normal.  Mouth/Throat: Uvula is midline, oropharynx is clear and moist and mucous membranes are normal.  Eyes: Conjunctivae and EOM are normal. Pupils are equal, round, and reactive to light.  Neck: Neck supple. No JVD present. No tracheal deviation present. No thyromegaly present.  Cardiovascular: Normal rate, regular rhythm, normal heart sounds and intact distal pulses.   Pulmonary/Chest: Effort normal and breath sounds normal.  Abdominal: Soft. Bowel sounds are normal. He exhibits no distension and no mass. There is no tenderness. There is no rebound and no guarding.  Musculoskeletal: Normal range of motion. He exhibits no edema or tenderness.  Lymphadenopathy:    He has no cervical adenopathy.  Neurological: He is alert and oriented to person, place, and time. He has normal reflexes. No cranial nerve deficit. He exhibits normal muscle tone. Coordination normal.  Skin: Skin is warm and dry.  Psychiatric: He has a normal mood and affect. His behavior is normal. Judgment and thought content normal.       Assessment & Plan:   Problem List Items Addressed This Visit      Other   Routine adult health maintenance - Primary    1) Anticipatory Guidance: Discussed importance of wearing a seatbelt while driving and not texting while driving; changing batteries in smoke detector at least once annually; wearing suntan lotion when outside; eating a balanced and moderate diet; getting physical activity at least 30 minutes per day.  2) Immunizations / Screenings / Labs:  Tetanus updated today. All other immunizations are up-to-date per recommendations. Obtain HIV antibody for HIV screening. Due for a  dental exam encouraged to be completed independently. All other screenings are up-to-date per recommendations. Obtain CBC and Lipid profile.  Overall well exam with risk factors for cardiovascular disease including obesity. Encouraged weight loss of approximately 5-10% of current body weight through lifestyle changes.Recommend increasing physical activity to 30 minutes of moderate level activity daily. Encourage nutritional intake that focuses on nutrient dense foods and is moderate, varied, and balanced and is low in saturated fats and processed/sugary foods. Blood pressure slightly elevated today and we'll continue to monitor at next office visit. Continue other healthy lifestyle behaviors and choices. Follow-up prevention exam in 1 year. Follow-up office visit pending blood work and for previous conditions.        Relevant Orders   CBC   Lipid panel   Hemoglobin A1c   HIV antibody    Other Visit Diagnoses    Need for diphtheria-tetanus-pertussis (Tdap) vaccine, adult/adolescent        Relevant Orders    Tdap vaccine greater than or equal to 7yo IM (Completed)

## 2015-06-09 NOTE — Progress Notes (Signed)
Pre visit review using our clinic review tool, if applicable. No additional management support is needed unless otherwise documented below in the visit note. 

## 2015-06-09 NOTE — Patient Instructions (Addendum)
Thank you for choosing [facility].  Summary/Instructions:  Please stop by the lab on the basement level of the building for your blood work. Your results will be released to MyChart (or called to you) after review, usually within 72 hours after test completion. If any changes need to be made, you will be notified at that same time.  Health Maintenance, Male A healthy lifestyle and preventative care can promote health and wellness.  Maintain regular health, dental, and eye exams.  Eat a healthy diet. Foods like vegetables, fruits, whole grains, low-fat dairy products, and lean protein foods contain the nutrients you need and are low in calories. Decrease your intake of foods high in solid fats, added sugars, and salt. Get information about a proper diet from your health care provider, if necessary.  Regular physical exercise is one of the most important things you can do for your health. Most adults should get at least 150 minutes of moderate-intensity exercise (any activity that increases your heart rate and causes you to sweat) each week. In addition, most adults need muscle-strengthening exercises on 2 or more days a week.   Maintain a healthy weight. The body mass index (BMI) is a screening tool to identify possible weight problems. It provides an estimate of body fat based on height and weight. Your health care provider can find your BMI and can help you achieve or maintain a healthy weight. For males 20 years and older:  A BMI below 18.5 is considered underweight.  A BMI of 18.5 to 24.9 is normal.  A BMI of 25 to 29.9 is considered overweight.  A BMI of 30 and above is considered obese.  Maintain normal blood lipids and cholesterol by exercising and minimizing your intake of saturated fat. Eat a balanced diet with plenty of fruits and vegetables. Blood tests for lipids and cholesterol should begin at age 20 and be repeated every 5 years. If your lipid or cholesterol levels are  high, you are over age 50, or you are at high risk for heart disease, you may need your cholesterol levels checked more frequently.Ongoing high lipid and cholesterol levels should be treated with medicines if diet and exercise are not working.  If you smoke, find out from your health care provider how to quit. If you do not use tobacco, do not start.  Lung cancer screening is recommended for adults aged 55-80 years who are at high risk for developing lung cancer because of a history of smoking. A yearly low-dose CT scan of the lungs is recommended for people who have at least a 30-pack-year history of smoking and are current smokers or have quit within the past 15 years. A pack year of smoking is smoking an average of 1 pack of cigarettes a day for 1 year (for example, a 30-pack-year history of smoking could mean smoking 1 pack a day for 30 years or 2 packs a day for 15 years). Yearly screening should continue until the smoker has stopped smoking for at least 15 years. Yearly screening should be stopped for people who develop a health problem that would prevent them from having lung cancer treatment.  If you choose to drink alcohol, do not have more than 2 drinks per day. One drink is considered to be 12 oz (360 mL) of beer, 5 oz (150 mL) of wine, or 1.5 oz (45 mL) of liquor.  Avoid the use of street drugs. Do not share needles with anyone. Ask for help if you need   support or instructions about stopping the use of drugs.  High blood pressure causes heart disease and increases the risk of stroke. High blood pressure is more likely to develop in:  People who have blood pressure in the end of the normal range (100-139/85-89 mm Hg).  People who are overweight or obese.  People who are African American.  If you are 18-39 years of age, have your blood pressure checked every 3-5 years. If you are 40 years of age or older, have your blood pressure checked every year. You should have your blood pressure  measured twice--once when you are at a hospital or clinic, and once when you are not at a hospital or clinic. Record the average of the two measurements. To check your blood pressure when you are not at a hospital or clinic, you can use:  An automated blood pressure machine at a pharmacy.  A home blood pressure monitor.  If you are 45-79 years old, ask your health care provider if you should take aspirin to prevent heart disease.  Diabetes screening involves taking a blood sample to check your fasting blood sugar level. This should be done once every 3 years after age 45 if you are at a normal weight and without risk factors for diabetes. Testing should be considered at a younger age or be carried out more frequently if you are overweight and have at least 1 risk factor for diabetes.  Colorectal cancer can be detected and often prevented. Most routine colorectal cancer screening begins at the age of 50 and continues through age 75. However, your health care provider may recommend screening at an earlier age if you have risk factors for colon cancer. On a yearly basis, your health care provider may provide home test kits to check for hidden blood in the stool. A small camera at the end of a tube may be used to directly examine the colon (sigmoidoscopy or colonoscopy) to detect the earliest forms of colorectal cancer. Talk to your health care provider about this at age 50 when routine screening begins. A direct exam of the colon should be repeated every 5-10 years through age 75, unless early forms of precancerous polyps or small growths are found.  People who are at an increased risk for hepatitis B should be screened for this virus. You are considered at high risk for hepatitis B if:  You were born in a country where hepatitis B occurs often. Talk with your health care provider about which countries are considered high risk.  Your parents were born in a high-risk country and you have not received a  shot to protect against hepatitis B (hepatitis B vaccine).  You have HIV or AIDS.  You use needles to inject street drugs.  You live with, or have sex with, someone who has hepatitis B.  You are a man who has sex with other men (MSM).  You get hemodialysis treatment.  You take certain medicines for conditions like cancer, organ transplantation, and autoimmune conditions.  Hepatitis C blood testing is recommended for all people born from 1945 through 1965 and any individual with known risk factors for hepatitis C.  Healthy men should no longer receive prostate-specific antigen (PSA) blood tests as part of routine cancer screening. Talk to your health care provider about prostate cancer screening.  Testicular cancer screening is not recommended for adolescents or adult males who have no symptoms. Screening includes self-exam, a health care provider exam, and other screening tests. Consult with your   health care provider about any symptoms you have or any concerns you have about testicular cancer.  Practice safe sex. Use condoms and avoid high-risk sexual practices to reduce the spread of sexually transmitted infections (STIs).  You should be screened for STIs, including gonorrhea and chlamydia if:  You are sexually active and are younger than 24 years.  You are older than 24 years, and your health care provider tells you that you are at risk for this type of infection.  Your sexual activity has changed since you were last screened, and you are at an increased risk for chlamydia or gonorrhea. Ask your health care provider if you are at risk.  If you are at risk of being infected with HIV, it is recommended that you take a prescription medicine daily to prevent HIV infection. This is called pre-exposure prophylaxis (PrEP). You are considered at risk if:  You are a man who has sex with other men (MSM).  You are a heterosexual man who is sexually active with multiple partners.  You take  drugs by injection.  You are sexually active with a partner who has HIV.  Talk with your health care provider about whether you are at high risk of being infected with HIV. If you choose to begin PrEP, you should first be tested for HIV. You should then be tested every 3 months for as long as you are taking PrEP.  Use sunscreen. Apply sunscreen liberally and repeatedly throughout the day. You should seek shade when your shadow is shorter than you. Protect yourself by wearing long sleeves, pants, a wide-brimmed hat, and sunglasses year round whenever you are outdoors.  Tell your health care provider of new moles or changes in moles, especially if there is a change in shape or color. Also, tell your health care provider if a mole is larger than the size of a pencil eraser.  A one-time screening for abdominal aortic aneurysm (AAA) and surgical repair of large AAAs by ultrasound is recommended for men aged 65-75 years who are current or former smokers.  Stay current with your vaccines (immunizations).   This information is not intended to replace advice given to you by your health care provider. Make sure you discuss any questions you have with your health care provider.   Document Released: 07/28/2007 Document Revised: 02/19/2014 Document Reviewed: 06/26/2010 Elsevier Interactive Patient Education 2016 Elsevier Inc.  

## 2015-06-10 ENCOUNTER — Telehealth: Payer: Self-pay | Admitting: Family

## 2015-06-10 LAB — HIV ANTIBODY (ROUTINE TESTING W REFLEX): HIV: NONREACTIVE

## 2015-06-10 NOTE — Telephone Encounter (Signed)
Please inform patient that his HIV was negative. His cholesterol and white/red blood cells are also normal. His A1c is slightly elevated placing him in pre-diabetes. No medication is needed for this, but I would recommend lifestyle changes in nutrition as we discussed during his office visit. We will follow up once I hear back from endocrinology.

## 2015-06-12 ENCOUNTER — Telehealth: Payer: Self-pay | Admitting: Family

## 2015-06-12 DIAGNOSIS — N62 Hypertrophy of breast: Secondary | ICD-10-CM

## 2015-06-12 DIAGNOSIS — N644 Mastodynia: Secondary | ICD-10-CM

## 2015-06-12 NOTE — Telephone Encounter (Signed)
Please inform patient that I have sent a referral to endocrinology and they will call to schedule.

## 2015-06-13 NOTE — Telephone Encounter (Signed)
Pt aware of results 

## 2015-06-14 NOTE — Telephone Encounter (Signed)
Pt aware.

## 2015-06-30 ENCOUNTER — Ambulatory Visit: Payer: BLUE CROSS/BLUE SHIELD | Admitting: Family

## 2015-07-06 ENCOUNTER — Encounter: Payer: Self-pay | Admitting: Endocrinology

## 2015-07-06 ENCOUNTER — Ambulatory Visit (INDEPENDENT_AMBULATORY_CARE_PROVIDER_SITE_OTHER): Payer: BLUE CROSS/BLUE SHIELD | Admitting: Endocrinology

## 2015-07-06 VITALS — BP 126/84 | HR 72 | Temp 98.2°F | Ht 72.0 in | Wt 301.0 lb

## 2015-07-06 DIAGNOSIS — N62 Hypertrophy of breast: Secondary | ICD-10-CM | POA: Diagnosis not present

## 2015-07-06 LAB — HCG, QUANTITATIVE, PREGNANCY: QUANTITATIVE HCG: 0.03 m[IU]/mL

## 2015-07-06 MED ORDER — TAMOXIFEN CITRATE 10 MG PO TABS: 10.0000 mg | ORAL_TABLET | Freq: Every day | ORAL | Status: DC

## 2015-07-06 NOTE — Progress Notes (Signed)
Subjective:    Patient ID: Dale Harrell, male    DOB: September 06, 1990, 25 y.o.   MRN: [id number]  HPI Pt reports few months of moderate swelling of both breasts, but no assoc d/c.  He has no children.   Past Medical History  Diagnosis Date  . Anxiety   . Chicken pox   . Hyperlipidemia   . Migraine     Not since 25 years of age.    Past Surgical History  Procedure Laterality Date  . Multiple tooth extractions      Social History   Social History  . Marital Status: Single    Spouse Name: N/A  . Number of Children: 0  . 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 Smoking status: Current Every Day Smoker -- 1.00 packs/day    Types: Cigars    Last Attempt to Quit: 01/06/2011  . Smokeless tobacco: Never Used  . Alcohol Use: 12.6 oz/week    21 Cans of beer per week  . Drug Use: No  . Sexual Activity: Not on file   Other Topics Concern  . Not on file   Social History Narrative   Fun: Play the piano    Current Outpatient Prescriptions on File Prior to Visit  Medication Sig Dispense Refill  . ibuprofen (ADVIL,MOTRIN) 800 MG tablet Take 1 tablet (800 mg total) by mouth every 8 (eight) hours as needed for moderate pain. 90 tablet 0   No current facility-administered medications on file prior to visit.    Allergies  Allergen Reactions  . Shellfish Allergy Itching    Throat itching    Family History  Problem Relation Age of Onset  . Diabetes      mother side of family  . Heart disease Brother   . [name] Parkinson White syndrome Brother   . Healthy Mother   . Healthy Father   . Other Neg Hx     gynecomastia    BP 126/84 mmHg  Pulse 72  Temp(Src) 98.2 F (36.8 C) (Oral)  Ht 6' (1.829 m)  Wt 301 lb (136.533 kg)  BMI 40.81 kg/m2  SpO2 96%   Review of Systems denies depression, mastalgia, numbness, erectile dysfunction, decreased urinary stream, muscle weakness, fever, headache, easy bruising, sob,  rash, blurry vision, rhinorrhea, chest pain.  He has gained weight.     Objective:   Physical Exam VS: see vs page GEN: no distress HEAD: head: no deformity eyes: no periorbital swelling, no proptosis external nose and ears are normal mouth: no lesion seen NECK: supple, thyroid is not enlarged CHEST WALL: no deformity LUNGS: clear to auscultation BREASTS:  bilat gynecomastia and pseudogynecomastia CV: reg rate and rhythm, no murmur ABD: abdomen is soft, nontender.  no hepatosplenomegaly.  not distended.  no hernia GENITALIA:  Normal male.   MUSCULOSKELETAL: muscle bulk and strength are grossly normal.  no obvious joint swelling.  gait is normal and steady EXTEMITIES: no deformity.  no edema PULSES: no carotid bruit NEURO:  cn 2-12 grossly intact.   readily moves all 4's.  sensation is intact to touch on all 4's.  SKIN:  Normal texture and temperature.  No rash or suspicious lesion is visible.  Normal hair distribution.  NODES:  None palpable at the neck PSYCH: alert, well-oriented.  Does not appear anxious nor depressed.   Lab Results  Component Value Date   TSH 2.16 06/07/2015   Lab  Results  Component Value Date   TESTOSTERONE 313.59 06/07/2015   HCG=normal  E2=52  Testicular US (2011): Left varicocele and left testicular cyst.    I have reviewed outside records, and summarized: Pt was noted to have gynecomastia, and referred here.  He was noted to have some mastalgia then.       Assessment & Plan:  Obesity: severe.   Gynecomastia, new, prob due to the obesity.     Patient is advised the following: Patient Instructions  blood tests are requested for you today.  We'll let you know about the results.  Based on the results, I think I can prescribe for you a pill to shrink the swelling.  Weight loss also helps.  Please come back for a follow-up appointment in 4 months.    addendum: i have sent a prescription to your pharmacy, for the breast swelling.

## 2015-07-06 NOTE — Patient Instructions (Addendum)
blood tests are requested for you today.  We'll let you know about the results.  Based on the results, I think I can prescribe for you a pill to shrink the swelling.  Weight loss also helps.  Please come back for a follow-up appointment in 4 months.

## 2015-07-07 ENCOUNTER — Encounter: Payer: Self-pay | Admitting: Family

## 2015-07-07 ENCOUNTER — Ambulatory Visit (INDEPENDENT_AMBULATORY_CARE_PROVIDER_SITE_OTHER): Payer: BLUE CROSS/BLUE SHIELD | Admitting: Family

## 2015-07-07 VITALS — BP 120/80 | HR 94 | Temp 97.7°F | Resp 16 | Ht 72.0 in | Wt 299.0 lb

## 2015-07-07 DIAGNOSIS — N62 Hypertrophy of breast: Secondary | ICD-10-CM | POA: Diagnosis not present

## 2015-07-07 NOTE — Assessment & Plan Note (Signed)
Recommend weight loss of 5-10% of current body weight through nutrition and physical activity. Recommend increasing physical activity to 30 minutes of moderate level activity daily. Encourage nutritional intake that focuses on nutrient dense foods and is moderate, varied, and balanced and is low in saturated fats and processed/sugary foods. Recommend tracking calories with MyFitnessPal. Continue to monitor.

## 2015-07-07 NOTE — Progress Notes (Signed)
Subjective:    Patient ID: Dale Harrell, male    DOB: [birth year], 25 y.o.   MRN: [id number]  Chief Complaint  Patient presents with  . Follow-up    HPI:  Dale Harrell is a 25 y.o. male who  has a past medical history of Anxiety; Chicken pox; Hyperlipidemia; and Migraine. and presents today for a follow up office visit.   1.) Gynecomastia - Previously diagnosed with gynecomastia with work up showing increased levels of estradiol. TSH and testosterone were within the normal limits. Discussed and referred to endocrinology for assistance with management. Appointment completed with recommendation for continued treatment with the addition of Tamoxifen to reduce the amount of estrogen and improve testosterone. Continues to experience the associated symptom of gynecomastia with significant improvement in mastalgia with only occasional discomfort and no further pain.     Allergies  Allergen Reactions  . Shellfish Allergy Itching    Throat itching     Current Outpatient Prescriptions on File Prior to Visit  Medication Sig Dispense Refill  . ibuprofen (ADVIL,MOTRIN) 800 MG tablet Take 1 tablet (800 mg total) by mouth every 8 (eight) hours as needed for moderate pain. 90 tablet 0  . tamoxifen (NOLVADEX) 10 MG tablet Take 1 tablet (10 mg total) by mouth daily. 30 tablet 11   No current facility-administered medications on file prior to visit.    Review of Systems  Constitutional: Negative for fever and chills.  Respiratory: Negative for chest tightness and shortness of breath.   Cardiovascular: Negative for chest pain, palpitations and leg swelling.  Neurological: Negative for weakness and headaches.      Objective:    BP 120/80 mmHg  Pulse 94  Temp(Src) 97.7 F (36.5 C) (Oral)  Resp 16  Ht 6' (1.829 m)  Wt 299 lb (135.626 kg)  BMI 40.54 kg/m2  SpO2 96% Nursing note and vital signs reviewed.  Physical Exam  Constitutional: He is oriented to person, place, and  time. He appears well-developed and well-nourished. No distress.  Cardiovascular: Normal rate, regular rhythm, normal heart sounds and intact distal pulses.   Pulmonary/Chest: Effort normal and breath sounds normal. Right breast exhibits no mass, no nipple discharge, no skin change and no tenderness. Left breast exhibits no mass, no nipple discharge, no skin change and no tenderness.  Neurological: He is alert and oriented to person, place, and time.  Skin: Skin is warm and dry.  Psychiatric: He has a normal mood and affect. His behavior is normal. Judgment and thought content normal.       Assessment & Plan:   Problem List Items Addressed This Visit      Other   Gynecomastia - Primary    Gynecomastia with addition of Tamoxifen to help reduce estrogen and increase testosterone. Recommend resistance training to assist with toning of musculature and reduce weight. Continue follow up and changes per endocrinology.      Morbid obesity (HCC)    Recommend weight loss of 5-10% of current body weight through nutrition and physical activity. Recommend increasing physical activity to 30 minutes of moderate level activity daily. Encourage nutritional intake that focuses on nutrient dense foods and is moderate, varied, and balanced and is low in saturated fats and processed/sugary foods. Recommend tracking calories with MyFitnessPal. Continue to monitor.           I am having Mr. Dale Harrell maintain his ibuprofen and tamoxifen.   Follow-up: Return in about 3 months (around 10/07/2015).  Dale Harrell, Dale Ambroise, FNP

## 2015-07-07 NOTE — Assessment & Plan Note (Signed)
Gynecomastia with addition of Tamoxifen to help reduce estrogen and increase testosterone. Recommend resistance training to assist with toning of musculature and reduce weight. Continue follow up and changes per endocrinology.

## 2015-07-07 NOTE — Patient Instructions (Addendum)
Thank you for choosing [facility].  Summary/Instructions:  Please continue to take your medication as prescribed.   If your symptoms worsen or fail to improve, please contact our office for further instruction, or in case of emergency go directly to the emergency room at the closest medical facility.     

## 2015-07-07 NOTE — Progress Notes (Signed)
Pre visit review using our clinic review tool, if applicable. No additional management support is needed unless otherwise documented below in the visit note. 

## 2015-07-14 ENCOUNTER — Other Ambulatory Visit: Payer: Self-pay | Admitting: *Deleted

## 2015-07-14 DIAGNOSIS — N644 Mastodynia: Secondary | ICD-10-CM

## 2015-07-14 MED ORDER — IBUPROFEN 800 MG PO TABS: 800.0000 mg | ORAL_TABLET | Freq: Three times a day (TID) | ORAL | Status: DC | PRN

## 2015-07-14 NOTE — Telephone Encounter (Signed)
Left msg on triage stating needing to know which pharmacy his medication went. Called pt back inform him per chart Dr. Marylyn Ishiharaelliosn sent his Tamoxifen to [facility] on [address]. He is also stating he need refill on Ibuprofen. Inform will send to pharmacy...Raechel Chute/lmb

## 2015-11-07 ENCOUNTER — Ambulatory Visit: Payer: BLUE CROSS/BLUE SHIELD | Admitting: Endocrinology

## 2015-11-08 DIAGNOSIS — Z0289 Encounter for other administrative examinations: Secondary | ICD-10-CM

## 2016-03-07 ENCOUNTER — Telehealth: Payer: Self-pay | Admitting: Family

## 2016-03-07 NOTE — Telephone Encounter (Signed)
PLEASE NOTE: All timestamps contained within this report are represented as [place] Standard Time. CONFIDENTIALTY NOTICE: This fax transmission is intended only for the addressee. It contains information that is legally privileged, confidential or otherwise protected from use or disclosure. If you are not the intended recipient, you are strictly prohibited from reviewing, disclosing, copying using or disseminating any of this information or taking any action in reliance on or regarding this information. If you have received this fax in error, please notify us immediately by telephone so that we can arrange for its return to us. Phone: [phone], Toll-Free: [phone], Fax: [phone] Page: 1 of 1 Call Id: [id number] [facility] - Client TELEPHONE ADVICE RECORD [facility] Patient Name: Dale StackCHRISTOPHER Arnall DOB: 1990/07/22 Initial Comment States having chest pain. Pain when moves body. Does not know who dr is. Nurse Assessment Nurse: Elijah Birkaldwell, RN, Lynda Date/Time (Eastern Time): 03/07/2016 5:04:34 PM Confirm and document reason for call. If symptomatic, describe symptoms. ---States having central chest pain. He also has pain when moves body. Does not know who Dr. is. Symptoms started after cold & cough which he had for several weeks. No fever. Initially thought it was a pulled muscle. Chest wall is tender to touch. Does the patient have any new or worsening symptoms? ---Yes Will a triage be completed? ---Yes Related visit to physician within the last 2 weeks? ---No Does the PT have any chronic conditions? (i.e. diabetes, asthma, etc.) ---No Is this a behavioral health or substance abuse call? ---No Guidelines Guideline Title Affirmed Question Affirmed Notes Chest Pain [1] Chest pain lasting <= 5 minutes AND [2] NO chest pain or cardiac symptoms now (Exceptions: pains lasting a few seconds) Final Disposition User See Physician within 24 Hours  Ivanhoealdwell, RN, Hilton HotelsLynda Comments Caller states if he coughs hard/deep, he will get the pain in the chest as well. States his cough & cold symptoms have resolved. Rates his chest tenderness a 7 on pain scale, states it is brief when it comes on. Referrals REFERRED TO PCP OFFICE Disagree/Comply: Comply

## 2016-03-08 ENCOUNTER — Ambulatory Visit (INDEPENDENT_AMBULATORY_CARE_PROVIDER_SITE_OTHER): Payer: BLUE CROSS/BLUE SHIELD | Admitting: Internal Medicine

## 2016-03-08 ENCOUNTER — Encounter: Payer: Self-pay | Admitting: Internal Medicine

## 2016-03-08 VITALS — BP 130/74 | HR 84 | Resp 20 | Wt 309.0 lb

## 2016-03-08 DIAGNOSIS — N644 Mastodynia: Secondary | ICD-10-CM

## 2016-03-08 DIAGNOSIS — R0789 Other chest pain: Secondary | ICD-10-CM | POA: Diagnosis not present

## 2016-03-08 MED ORDER — IBUPROFEN 800 MG PO TABS: 800.0000 mg | ORAL_TABLET | Freq: Three times a day (TID) | ORAL | 0 refills | Status: DC | PRN

## 2016-03-08 NOTE — Assessment & Plan Note (Signed)
Atypical, etiology unclear, ecg reviewed with pt - no evidence for ACS, suspect primarily MSK - for ibuprofen refill asd, also for cxr tomorrow (is closed tonight),  to f/u any worsening symptoms or concerns

## 2016-03-08 NOTE — Progress Notes (Signed)
   Subjective:    Patient ID: Dale DickerChristopher M Harrell, male    DOB: 1990/12/16, 26 y.o.   MRN: [id number]  HPI  Here with 3 wks onset SSCP, sharp without radiation, sob, diaphoresis, n/v, palp or dizziness.  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  Denies ST, cough, fever or wheezing.  Denies worsening reflux, abd pain, dysphagia, n/v, bowel change or blood.  Pt denies new neurological symptoms such as new headache, or facial or extremity weakness or numbness  Pt denies polydipsia, polyuria  No other new history Past Medical History:  Diagnosis Date  . Anxiety   . Chicken pox   . Hyperlipidemia   . Migraine    Not since 26 years of age.   Past Surgical History:  Procedure Laterality Date  . MULTIPLE TOOTH EXTRACTIONS      reports that he has been smoking Cigars.  He has been smoking about 1.00 pack per day. He has never used smokeless tobacco. He reports that he drinks about 12.6 oz of alcohol per week . He reports that he does not use drugs. family history includes Healthy in his father and mother; Heart disease in his brother; Evelene CroonWolff Parkinson White syndrome in his brother. Allergies  Allergen Reactions  . Shellfish Allergy Itching    Throat itching   Current Outpatient Prescriptions on File Prior to Visit  Medication Sig Dispense Refill  . ibuprofen (ADVIL,MOTRIN) 800 MG tablet Take 1 tablet (800 mg total) by mouth every 8 (eight) hours as needed for moderate pain. 90 tablet 0  . tamoxifen (NOLVADEX) 10 MG tablet Take 1 tablet (10 mg total) by mouth daily. 30 tablet 11   No current facility-administered medications on file prior to visit.     Review of Systems  All other system neg per pt    Objective:   Physical Exam BP 130/74   Pulse 84   Resp 20   Wt (!) 309 lb (140.2 kg)   SpO2 97%   BMI 41.91 kg/m \\VS  noted,    Constitutional: Pt appears in no apparent distress HENT: Head: NCAT.  Right Ear: External ear normal.  Left Ear: External ear normal.  Eyes: . Pupils are equal, round, and reactive to light. Conjunctivae and EOM are normal Neck: Normal range of motion. Neck supple.  Cardiovascular: Normal rate and regular rhythm.  , no gallop, rub or click Pulmonary/Chest: Effort normal and breath sounds without rales or wheezing.  Abd:  Soft, NT, ND, + BS Neurological: Pt is alert. Not confused , motor grossly intact Skin: Skin is warm. No rash, no LE edema Psychiatric: Pt behavior is normal. No agitation.  No other new exam findings  ECG today that I have personally interpreted: Sinus  Rhythm  -  Nonspecific T-abnormality  -Nondiagnostic for age.      Assessment & Plan:

## 2016-03-08 NOTE — Patient Instructions (Signed)
Your EKG is OK today  Please take all new medication as prescribed - the ibuprofen  Please continue all other medications as before, and refills have been done if requested.  Please have the pharmacy call with any other refills you may need.  Please keep your appointments with your specialists as you may have planned  Please go to the XRAY Department in the Basement (go straight as you get off the elevator) for the x-ray testing  You will be contacted by phone if any changes need to be made immediately.  Otherwise, you will receive a letter about your results with an explanation, but please check with MyChart first.  Please remember to sign up for MyChart if you have not done so, as this will be important to you in the future with finding out test results, communicating by private email, and scheduling acute appointments online when needed.

## 2016-03-08 NOTE — Progress Notes (Signed)
Pre visit review using our clinic review tool, if applicable. No additional management support is needed unless otherwise documented below in the visit note. 

## 2016-03-09 ENCOUNTER — Ambulatory Visit (INDEPENDENT_AMBULATORY_CARE_PROVIDER_SITE_OTHER)
Admission: RE | Admit: 2016-03-09 | Discharge: 2016-03-09 | Disposition: A | Discharge status: Home / Self Care | Payer: BLUE CROSS/BLUE SHIELD | Source: Ambulatory Visit | Attending: Internal Medicine | Admitting: Internal Medicine

## 2016-03-09 ENCOUNTER — Encounter: Payer: Self-pay | Admitting: Internal Medicine

## 2016-03-09 DIAGNOSIS — R0789 Other chest pain: Secondary | ICD-10-CM

## 2016-06-06 ENCOUNTER — Encounter: Payer: Self-pay | Admitting: Family

## 2017-03-25 ENCOUNTER — Telehealth: Payer: Self-pay | Admitting: Internal Medicine

## 2017-03-25 NOTE — Telephone Encounter (Signed)
Ok with me 

## 2017-03-25 NOTE — Telephone Encounter (Signed)
Copied from CRM [id number]. Topic: Appointment Scheduling - Scheduling Inquiry for Clinic >> Mar 25, 2017 11:26 AM Waymon AmatoBurton, Donna F wrote: CRM for notification. See Telephone encounter for:  Pt is needing to establish care with Dr. Jonny RuizJohn since he last saw Dr. Carver Harrell and would like a call back of  03/25/17.

## 2017-03-25 NOTE — Telephone Encounter (Signed)
Please advise if you approve. Thank you.

## 2017-03-26 NOTE — Telephone Encounter (Signed)
Appointment has been made for 2/18, New patient paperwork has been sent.

## 2017-04-01 ENCOUNTER — Encounter: Payer: Self-pay | Admitting: Internal Medicine

## 2017-04-01 ENCOUNTER — Ambulatory Visit: Payer: PRIVATE HEALTH INSURANCE | Admitting: Internal Medicine

## 2017-04-01 ENCOUNTER — Other Ambulatory Visit (INDEPENDENT_AMBULATORY_CARE_PROVIDER_SITE_OTHER): Payer: PRIVATE HEALTH INSURANCE

## 2017-04-01 VITALS — BP 124/82 | HR 97 | Ht 72.0 in | Wt 318.0 lb

## 2017-04-01 DIAGNOSIS — E538 Deficiency of other specified B group vitamins: Secondary | ICD-10-CM

## 2017-04-01 DIAGNOSIS — E291 Testicular hypofunction: Secondary | ICD-10-CM

## 2017-04-01 DIAGNOSIS — F419 Anxiety disorder, unspecified: Secondary | ICD-10-CM | POA: Diagnosis not present

## 2017-04-01 DIAGNOSIS — E559 Vitamin D deficiency, unspecified: Secondary | ICD-10-CM

## 2017-04-01 DIAGNOSIS — R0789 Other chest pain: Secondary | ICD-10-CM

## 2017-04-01 DIAGNOSIS — Z0001 Encounter for general adult medical examination with abnormal findings: Secondary | ICD-10-CM

## 2017-04-01 DIAGNOSIS — M722 Plantar fascial fibromatosis: Secondary | ICD-10-CM | POA: Diagnosis not present

## 2017-04-01 DIAGNOSIS — R739 Hyperglycemia, unspecified: Secondary | ICD-10-CM

## 2017-04-01 LAB — URINALYSIS, ROUTINE W REFLEX MICROSCOPIC
BILIRUBIN URINE: NEGATIVE
Hgb urine dipstick: NEGATIVE
Leukocytes, UA: NEGATIVE
NITRITE: NEGATIVE
PH: 6 (ref 5.0–8.0)
SPECIFIC GRAVITY, URINE: 1.025 (ref 1.000–1.030)
URINE GLUCOSE: NEGATIVE
UROBILINOGEN UA: 0.2 (ref 0.0–1.0)

## 2017-04-01 LAB — CBC WITH DIFFERENTIAL/PLATELET
BASOS PCT: 0.5 % (ref 0.0–3.0)
Basophils Absolute: 0 10*3/uL (ref 0.0–0.1)
EOS PCT: 1.3 % (ref 0.0–5.0)
Eosinophils Absolute: 0.1 10*3/uL (ref 0.0–0.7)
HCT: 41.5 % (ref 39.0–52.0)
Hemoglobin: 13.7 g/dL (ref 13.0–17.0)
LYMPHS ABS: 3 10*3/uL (ref 0.7–4.0)
Lymphocytes Relative: 46.2 % — ABNORMAL HIGH (ref 12.0–46.0)
MCHC: 33 g/dL (ref 30.0–36.0)
MCV: 79.2 fl (ref 78.0–100.0)
MONO ABS: 0.6 10*3/uL (ref 0.1–1.0)
Monocytes Relative: 9.8 % (ref 3.0–12.0)
NEUTROS PCT: 42.2 % — AB (ref 43.0–77.0)
Neutro Abs: 2.7 10*3/uL (ref 1.4–7.7)
Platelets: 280 10*3/uL (ref 150.0–400.0)
RBC: 5.24 Mil/uL (ref 4.22–5.81)
RDW: 12.9 % (ref 11.5–15.5)
WBC: 6.4 10*3/uL (ref 4.0–10.5)

## 2017-04-01 LAB — HEMOGLOBIN A1C: HEMOGLOBIN A1C: 6.4 % (ref 4.6–6.5)

## 2017-04-01 MED ORDER — MELOXICAM 15 MG PO TABS: 15.0000 mg | ORAL_TABLET | Freq: Every day | ORAL | 1 refills | Status: DC | PRN

## 2017-04-01 NOTE — Assessment & Plan Note (Signed)
Asympt, but for a1c with labs, cont to work on wt control

## 2017-04-01 NOTE — Assessment & Plan Note (Signed)

## 2017-04-01 NOTE — Assessment & Plan Note (Signed)
Ok for f/u lab today, consider sperm analysis after testosterone tx if needed

## 2017-04-01 NOTE — Assessment & Plan Note (Signed)
Mild, declines tx or counseling referral for now

## 2017-04-01 NOTE — Patient Instructions (Addendum)
Please take all new medication as prescribed - the mobic for pain  Please continue all other medications as before, and refills have been done if requested.  Please have the pharmacy call with any other refills you may need.  Please continue your efforts at being more active, low cholesterol diet, and weight control.  You are otherwise up to date with prevention measures today.  Please keep your appointments with your specialists as you may have planned  Please go to the LAB in the Basement (turn left off the elevator) for the tests to be done today  You will be contacted by phone if any changes need to be made immediately.  Otherwise, you will receive a letter about your results with an explanation, but please check with MyChart first.  Please remember to sign up for MyChart if you have not done so, as this will be important to you in the future with finding out test results, communicating by private email, and scheduling acute appointments online when needed.  Please return in 1 year for your yearly visit, or sooner if needed

## 2017-04-01 NOTE — Assessment & Plan Note (Signed)
Mild to mod, for mobic prn, to f/u any worsening symptoms or concerns 

## 2017-04-01 NOTE — Progress Notes (Signed)
Subjective:    Patient ID: Dale Harrell, male    DOB: 1991-01-19, 27 y.o.   MRN: [id number]  HPI  Here for wellness and f/u;  Overall doing ok;  Pt denies worsening SOB, DOE, wheezing, orthopnea, PND, worsening LE edema, palpitations, dizziness or syncope.  Pt denies neurological change such as new headache, facial or extremity weakness.  Pt denies polydipsia, polyuria, or low sugar symptoms. Pt states overall good compliance with treatment and medications, good tolerability, and has been trying to follow appropriate diet. No fever, night sweats, wt loss, loss of appetite, or other constitutional symptoms.  Pt states good ability with ADL's, has low fall risk, home safety reviewed and adequate, no other significant changes in hearing or vision, and only occasionally active with exercise. 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  Also c/o upper mid sternal CP sharp without radiation or assoc symptoms, and only noted when he stretches his arms behind him at the same time and chest puffs out.  Pain is not pleuritic, exertional or positional.  CXR jan 2018 - NAD.  Also c/o bilat plantar feet pain, sharp and mod, worse with first steps in the AM or when getting in and out of the vehicle after driving for some time.  Also has hx of low testosterone and low sperm count, asks for repeat lab and tx as he is now considering having children.  No other interval hx or complaint Past Medical History:  Diagnosis Date  . Anxiety   . Chicken pox   . Hyperlipidemia   . Migraine    Not since 27 years of age.   Past Surgical History:  Procedure Laterality Date    . MULTIPLE TOOTH EXTRACTIONS      reports that he has been smoking cigars.  He has been smoking about 1.00 pack per day. he has never used smokeless tobacco. He reports that he drinks about 12.6 oz of alcohol per week. He reports that he does not use drugs. family history includes Diabetes in his unknown relative; Healthy in his father and mother; Heart disease in his brother; [name] Parkinson White syndrome in his brother. Allergies  Allergen Reactions  . Shellfish Allergy Itching    Throat itching   Current Outpatient Medications on File Prior to Visit  Medication Sig Dispense Refill  . ibuprofen (ADVIL,MOTRIN) 800 MG tablet Take 1 tablet (800 mg total) by mouth every 8 (eight) hours as needed for moderate pain. 90 tablet 0  . tamoxifen (NOLVADEX) 10 MG tablet Take 1 tablet (10 mg total) by mouth daily. 30 tablet 11   No current facility-administered medications on file prior to visit.    Review of Systems Constitutional: Negative for other unusual diaphoresis, sweats, appetite or weight changes HENT: Negative for other worsening hearing loss, ear pain, facial swelling, mouth sores or neck stiffness.   Eyes: Negative for other worsening pain, redness or other visual disturbance.  Respiratory: Negative for other stridor or swelling Cardiovascular: Negative for other palpitations or other chest pain  Gastrointestinal: Negative for worsening diarrhea or loose stools, blood in stool, distention or other pain Genitourinary: Negative  for hematuria, flank pain or other change in urine volume.  Musculoskeletal: Negative for myalgias or other joint swelling.  Skin: Negative for other color change, or other wound or worsening drainage.  Neurological: Negative for other syncope or numbness. Hematological: Negative for other adenopathy or swelling Psychiatric/Behavioral: Negative for hallucinations, other worsening agitation, SI, self-injury, or new decreased concentration All other system neg  per pt    Objective:   Physical Exam BP 124/82   Pulse 97   Ht 6' (1.829 m)   Wt (!) 318 lb (144.2 kg)   SpO2 98%   BMI 43.13 kg/m  VS noted,  Constitutional: Pt is oriented to person, place, and time. Appears well-developed and well-nourished, in no significant distress and comfortable Head: Normocephalic and atraumatic  Eyes: Conjunctivae and EOM are normal. Pupils are equal, round, and reactive to light Right Ear: External ear normal without discharge Left Ear: External ear normal without discharge Nose: Nose without discharge or deformity Mouth/Throat: Oropharynx is without other ulcerations and moist  Neck: Normal range of motion. Neck supple. No JVD present. No tracheal deviation present or significant neck LA or mass Cardiovascular: Normal rate, regular rhythm, normal heart sounds and intact distal pulses.   Pulmonary/Chest: WOB normal and breath sounds without rales or wheezing  Abdominal: Soft. Bowel sounds are normal. NT. No HSM  Musculoskeletal: Normal range of motion. Exhibits no edema Lymphadenopathy: Has no other cervical adenopathy.  Neurological: Pt is alert and oriented to person, place, and time. Pt has normal reflexes. No cranial nerve deficit. Motor grossly intact, Gait intact Skin: Skin is warm and dry. No rash noted or new ulcerations Psychiatric:  Has mild nervous mood and affect. Behavior is normal without agitation No other exam findings Lab Results  Component Value Date   WBC 7.9 06/09/2015   HGB 14.1 06/09/2015   HCT 41.8 06/09/2015   PLT 285.0 06/09/2015   GLUCOSE 113 (H) 06/07/2015   CHOL 148 06/09/2015   TRIG 92.0 06/09/2015   HDL 43.30 06/09/2015   LDLCALC 86 06/09/2015   ALT 43 06/07/2015   AST 23 06/07/2015   NA 139 06/07/2015   K 4.2 06/07/2015   CL 101 06/07/2015   CREATININE 1.08 06/07/2015   BUN 13 06/07/2015   CO2 29 06/07/2015   TSH 2.16 06/07/2015   HGBA1C 6.3 06/09/2015       Assessment & Plan:

## 2017-04-01 NOTE — Assessment & Plan Note (Addendum)
Atypical, very low suspicion for cardiac, declines cxr, for mobic prn for MSK pain  In addition to the time spent performing CPE, I spent an additional 25 minutes face to face,in which greater than 50% of this time was spent in counseling and coordination of care for patient's acute illness as documented, including the differential dx, treatment, further evaluation and other management of chest pain, hyperglycemia, hypogonadism, bilateral plantar fasciitis, and anxiety

## 2017-04-02 ENCOUNTER — Telehealth: Payer: Self-pay

## 2017-04-02 ENCOUNTER — Other Ambulatory Visit: Payer: Self-pay | Admitting: Internal Medicine

## 2017-04-02 LAB — HEPATIC FUNCTION PANEL
ALT: 30 U/L (ref 0–53)
AST: 17 U/L (ref 0–37)
Albumin: 4.3 g/dL (ref 3.5–5.2)
Alkaline Phosphatase: 61 U/L (ref 39–117)
BILIRUBIN DIRECT: 0.1 mg/dL (ref 0.0–0.3)
Total Bilirubin: 0.5 mg/dL (ref 0.2–1.2)
Total Protein: 7.4 g/dL (ref 6.0–8.3)

## 2017-04-02 LAB — VITAMIN D 25 HYDROXY (VIT D DEFICIENCY, FRACTURES): VITD: 28.2 ng/mL — ABNORMAL LOW (ref 30.00–100.00)

## 2017-04-02 LAB — LIPID PANEL
CHOL/HDL RATIO: 4
Cholesterol: 142 mg/dL (ref 0–200)
HDL: 35.5 mg/dL — AB (ref >39.00)
LDL CALC: 82 mg/dL (ref 0–99)
NONHDL: 106.38
Triglycerides: 120 mg/dL (ref 0.0–149.0)
VLDL: 24 mg/dL (ref 0.0–40.0)

## 2017-04-02 LAB — TESTOSTERONE: TESTOSTERONE: 269.54 ng/dL — AB (ref 300.00–890.00)

## 2017-04-02 LAB — BASIC METABOLIC PANEL
BUN: 14 mg/dL (ref 6–23)
CHLORIDE: 104 meq/L (ref 96–112)
CO2: 27 meq/L (ref 19–32)
Calcium: 9.7 mg/dL (ref 8.4–10.5)
Creatinine, Ser: 1.11 mg/dL (ref 0.40–1.50)
GFR: 102.19 mL/min (ref >60.00)
GLUCOSE: 92 mg/dL (ref 70–99)
POTASSIUM: 4 meq/L (ref 3.5–5.1)
SODIUM: 138 meq/L (ref 135–145)

## 2017-04-02 LAB — VITAMIN B12: VITAMIN B 12: 356 pg/mL (ref 211–911)

## 2017-04-02 LAB — LUTEINIZING HORMONE: LH: 4.4 m[IU]/mL (ref 1.50–9.30)

## 2017-04-02 LAB — TSH: TSH: 1.6 u[IU]/mL (ref 0.35–4.50)

## 2017-04-02 MED ORDER — VITAMIN D (ERGOCALCIFEROL) 1.25 MG (50000 UNIT) PO CAPS: 50000.0000 [IU] | ORAL_CAPSULE | ORAL | 0 refills | Status: DC

## 2017-04-02 MED ORDER — TESTOSTERONE 50 MG/5GM (1%) TD GEL: 5.0000 g | Freq: Every day | TRANSDERMAL | 1 refills | Status: DC

## 2017-04-02 NOTE — Telephone Encounter (Signed)
-----   Message from Corwin LevinsJames W John, MD sent at 04/02/2017 12:33 PM EST ----- Left message on MyChart, pt to cont same tx except  The test results show that your current treatment is OK, except the Testosterone is low, as well as the Vitamin D.  We will send a new prescription for Testim (for low testosterone), and also Vit D 50000 units per week for 12 wks only, then to take 2000 units Vit D OTC after that indefinitely  Allyna Pittsley to please inform pt, I will do rx x 2

## 2017-04-02 NOTE — Telephone Encounter (Signed)
Pt has been informed and expressed understanding.  

## 2017-04-03 MED ORDER — VITAMIN D (ERGOCALCIFEROL) 1.25 MG (50000 UNIT) PO CAPS: 50000.0000 [IU] | ORAL_CAPSULE | ORAL | 0 refills | Status: DC

## 2017-04-03 MED ORDER — TESTOSTERONE 50 MG/5GM (1%) TD GEL: 5.0000 g | Freq: Every day | TRANSDERMAL | 1 refills | Status: DC

## 2017-04-03 NOTE — Telephone Encounter (Signed)
rx done to [facility] on [address]

## 2017-04-03 NOTE — Telephone Encounter (Signed)
Pt meds was called into the wrong pharmacy. Please send to pharmacy below:  [id number]- [place], [place] - [address] [facility] [phone] (Phone) [phone] (Fax)

## 2017-04-03 NOTE — Addendum Note (Signed)
Addended by: Corwin LevinsJOHN, JAMES W on: 04/03/2017 03:41 PM   Modules accepted: Orders

## 2017-04-03 NOTE — Addendum Note (Signed)
Addended by: Roney MansGAY, Lillias Difrancesco on: 04/03/2017 03:24 PM   Modules accepted: Orders

## 2017-04-05 ENCOUNTER — Telehealth: Payer: Self-pay

## 2017-04-05 ENCOUNTER — Telehealth: Payer: Self-pay | Admitting: Internal Medicine

## 2017-04-05 NOTE — Telephone Encounter (Signed)
PA has already been initiated. Waiting for insurance response.

## 2017-04-05 NOTE — Telephone Encounter (Signed)
Approved through 04/05/2018  Determination sent to scan

## 2017-04-05 NOTE — Telephone Encounter (Signed)
Copied from CRM [phone]. Topic: General - Other >> Apr 05, 2017  9:25 AM Cecelia ByarsGreen, Bogdan Vivona L, RMA wrote: Reason for CRM: Pharmacy is requesting a prior authorization for prescription for  testosterone (TESTIM) 50 MG/5GM (1%) GEL

## 2017-04-12 ENCOUNTER — Ambulatory Visit: Payer: Self-pay | Admitting: *Deleted

## 2017-04-12 NOTE — Telephone Encounter (Signed)
Results mailed 

## 2017-04-12 NOTE — Telephone Encounter (Signed)
  Reason for Disposition . [1] Follow-up call to recent contact AND [2] information only call, no triage required  Answer Assessment - Initial Assessment Questions 1. REASON FOR CALL or QUESTION: "What is your reason for calling today?" or "How can I best help you?" or "What question do you have that I can help answer?"      Patient was prescribed Testosterone gel and he reading the possible side effects. He wanted to know what most people complain about. He is mostly concerned about the possibility of depression and anxiety. Discussed that the medication is prescribed for specific problem and the hope is it will help with that problem. He will need to be aware of any side effects and any changes he may be experiencing after he starts the medication. He will need to report them to the provider. I will forward his question to the nurse for her review.       Patient requests a call from a nurse regarding concerns over a new medication is is about to start taking. Please call today. Thank You!!!  Protocols used: INFORMATION ONLY CALL-A-AH

## 2017-04-12 NOTE — Telephone Encounter (Signed)
One of the symptoms of low sugar can be depression  Testosterone does not cause this  He can ignore the side effects since they are provided by the lawyers if any patient complained of depression while taking the medication in the trials to get the med approved

## 2017-04-15 NOTE — Telephone Encounter (Signed)
Pt has been informed and expressed understanding.  

## 2017-06-06 ENCOUNTER — Ambulatory Visit: Payer: PRIVATE HEALTH INSURANCE | Admitting: Internal Medicine

## 2017-06-06 ENCOUNTER — Ambulatory Visit: Payer: PRIVATE HEALTH INSURANCE | Admitting: Allergy & Immunology

## 2017-06-06 ENCOUNTER — Encounter: Payer: Self-pay | Admitting: Allergy & Immunology

## 2017-06-06 VITALS — BP 136/80 | HR 76 | Temp 97.8°F | Resp 20 | Ht 72.0 in | Wt 318.4 lb

## 2017-06-06 DIAGNOSIS — J3089 Other allergic rhinitis: Secondary | ICD-10-CM

## 2017-06-06 DIAGNOSIS — J302 Other seasonal allergic rhinitis: Secondary | ICD-10-CM | POA: Diagnosis not present

## 2017-06-06 DIAGNOSIS — T7800XD Anaphylactic reaction due to unspecified food, subsequent encounter: Secondary | ICD-10-CM

## 2017-06-06 MED ORDER — AUVI-Q 0.3 MG/0.3ML IJ SOAJ: 0.3000 mg | Freq: Once | INTRAMUSCULAR | 3 refills | Status: AC

## 2017-06-06 NOTE — Patient Instructions (Addendum)
1. Seasonal and perennial allergic rhinitis - Testing today showed: trees, weeds, grasses, indoor molds, outdoor molds, dust mites and cockroach - Avoidance measures provided. - Start taking: Zyrtec (cetirizine) 10mg  tablet once daily and Nasacort (triamcinolone) two sprays per nostril daily - You can use an extra dose of the antihistamine, if needed, for breakthrough symptoms.  - Consider nasal saline rinses 1-2 times daily to remove allergens from the nasal cavities as well as help with mucous clearance (this is especially helpful to do before the nasal sprays are given) - Consider allergy shots as a means of long-term control. - Allergy shots "re-train" and "reset" the immune system to ignore environmental allergens and decrease the resulting immune response to those allergens (sneezing, itchy watery eyes, runny nose, nasal congestion, etc).    - Allergy shots improve symptoms in 75-85% of patients.  - We can discuss more at the next appointment if the medications are not working for you.  2. Anaphylactic shock due to food, subsequent encounter - Testing was positive to: Murphy OilLobster and Oyster - Avoid the above foods for now.  - Testing was negative to [place], Catfish, [place], [place], [place], [place], [place], [place]fish, Shrimp, Cham[place]ab and [place]op  - Since these were negative, you CAN eat other fish and shellfish.  - But beware of the dangers of cross contamination and talk to your waiter about your allergies.  - Training for epinephrine auto-injectors provided: AuviQ (they should call you in 1-2 days to confirm your shipping address)  - We do work with a Registered Dietician if you would like more information on managing Davonne's diet with food allergies. - In general, peanut, tree nut, and seafood allergies are life-long after age 27 or so.  3. Return in about 6 months (around 12/06/2017).   Please inform us of any Emergency Department visits, hospitalizations, or changes in symptoms. Call us  before going to the ED for breathing or allergy symptoms since we might be able to fit you in for a sick visit. Feel free to contact us anytime with any questions, problems, or concerns.  It was a pleasure to meet you today!  Websites that have reliable patient information: 1. American Academy of Asthma, Allergy, and Immunology: www.aaaai.org 2. Food Allergy Research and Education (FARE): foodallergy.org 3. Mothers of Asthmatics: http://www.asthmacommunitynetwork.org 4. American College of Allergy, Asthma, and Immunology: www.acaai.org  Reducing Pollen Exposure  The American Academy of Allergy, Asthma and Immunology suggests the following steps to reduce your exposure to pollen during allergy seasons.    1. Do not hang sheets or clothing out to dry; pollen may collect on these items. 2. Do not mow lawns or spend time around freshly cut grass; mowing stirs up pollen. 3. Keep windows closed at night.  Keep car windows closed while driving. 4. Minimize morning activities outdoors, a time when pollen counts are usually at their highest. 5. Stay indoors as much as possible when pollen counts or humidity is high and on windy days when pollen tends to remain in the air longer. 6. Use air conditioning when possible.  Many air conditioners have filters that trap the pollen spores. 7. Use a HEPA room air filter to remove pollen form the indoor air you breathe.  Control of Mold Allergen   Mold and fungi can grow on a variety of surfaces provided certain temperature and moisture conditions exist.  Outdoor molds grow on plants, decaying vegetation and soil.  The major outdoor mold, Alternaria and Cladosporium, are found in very high numbers  during hot and dry conditions.  Generally, a late Summer - Fall peak is seen for common outdoor fungal spores.  Rain will temporarily lower outdoor mold spore count, but counts rise rapidly when the rainy period ends.  The most important indoor molds are Aspergillus  and Penicillium.  Dark, humid and poorly ventilated basements are ideal sites for mold growth.  The next most common sites of mold growth are the bathroom and the kitchen.  Outdoor (Seasonal) Mold Control  Positive outdoor molds via skin testing: Alternaria, Cladosporium and Drechslera (Curvalaria)  1. Use air conditioning and keep windows closed 2. Avoid exposure to decaying vegetation. 3. Avoid leaf raking. 4. Avoid grain handling. 5. Consider wearing a face mask if working in moldy areas.  6.   Indoor (Perennial) Mold Control   Positive indoor molds via skin testing: Aspergillus, Fusarium, Phoma and Trichophyton  1. Maintain humidity below 50%. 2. Clean washable surfaces with 5% bleach solution. 3. Remove sources e.g. contaminated carpets.     Control of Cockroach Allergen  Cockroach allergen has been identified as an important cause of acute attacks of asthma, especially in urban settings.  There are fifty-five species of cockroach that exist in the [place], however only three, the [place], [place] species produce allergen that can affect patients with Asthma.  Allergens can be obtained from fecal particles, egg casings and secretions from cockroaches.    1. Remove food sources. 2. Reduce access to water. 3. Seal access and entry points. 4. Spray runways with 0.5-1% Diazinon or Chlorpyrifos 5. Blow boric acid power under stoves and refrigerator. 6. Place bait stations (hydramethylnon) at feeding sites.  Control of House Dust Mite Allergen    House dust mites play a major role in allergic asthma and rhinitis.  They occur in environments with high humidity wherever human skin, the food for dust mites is found. High levels have been detected in dust obtained from mattresses, pillows, carpets, upholstered furniture, bed covers, clothes and soft toys.  The principal allergen of the house dust mite is found in its feces.  A gram of dust may contain 1,000 mites  and 250,000 fecal particles.  Mite antigen is easily measured in the air during house cleaning activities.    1. Encase mattresses, including the box spring, and pillow, in an air tight cover.  Seal the zipper end of the encased mattresses with wide adhesive tape. 2. Wash the bedding in water of 130 degrees Farenheit weekly.  Avoid cotton comforters/quilts and flannel bedding: the most ideal bed covering is the dacron comforter. 3. Remove all upholstered furniture from the bedroom. 4. Remove carpets, carpet padding, rugs, and non-washable window drapes from the bedroom.  Wash drapes weekly or use plastic window coverings. 5. Remove all non-washable stuffed toys from the bedroom.  Wash stuffed toys weekly. 6. Have the room cleaned frequently with a vacuum cleaner and a damp dust-mop.  The patient should not be in a room which is being cleaned and should wait 1 hour after cleaning before going into the room. 7. Close and seal all heating outlets in the bedroom.  Otherwise, the room will become filled with dust-laden air.  An electric heater can be used to heat the room. 8. Reduce indoor humidity to less than 50%.  Do not use a humidifier.  Allergy Shots   Allergies are the result of a chain reaction that starts in the immune system. Your immune system controls how your body defends itself. For instance, if  you have an allergy to pollen, your immune system identifies pollen as an invader or allergen. Your immune system overreacts by producing antibodies called Immunoglobulin E (IgE). These antibodies travel to cells that release chemicals, causing an allergic reaction.  The concept behind allergy immunotherapy, whether it is received in the form of shots or tablets, is that the immune system can be desensitized to specific allergens that trigger allergy symptoms. Although it requires time and patience, the payback can be long-term relief.  How Do Allergy Shots Work?  Allergy shots work much like a  vaccine. Your body responds to injected amounts of a particular allergen given in increasing doses, eventually developing a resistance and tolerance to it. Allergy shots can lead to decreased, minimal or no allergy symptoms.  There generally are two phases: build-up and maintenance. Build-up often ranges from three to six months and involves receiving injections with increasing amounts of the allergens. The shots are typically given once or twice a week, though more rapid build-up schedules are sometimes used.  The maintenance phase begins when the most effective dose is reached. This dose is different for each person, depending on how allergic you are and your response to the build-up injections. Once the maintenance dose is reached, there are longer periods between injections, typically two to four weeks.  Occasionally doctors give cortisone-type shots that can temporarily reduce allergy symptoms. These types of shots are different and should not be confused with allergy immunotherapy shots.  Who Can Be Treated with Allergy Shots?  Allergy shots may be a good treatment approach for people with allergic rhinitis (hay fever), allergic asthma, conjunctivitis (eye allergy) or stinging insect allergy.   Before deciding to begin allergy shots, you should consider:  . The length of allergy season and the severity of your symptoms . Whether medications and/or changes to your environment can control your symptoms . Your desire to avoid long-term medication use . Time: allergy immunotherapy requires a major time commitment . Cost: may vary depending on your insurance coverage  Allergy shots for children age 63 and older are effective and often well tolerated. They might prevent the onset of new allergen sensitivities or the progression to asthma.  Allergy shots are not started on patients who are pregnant but can be continued on patients who become pregnant while receiving them. In some patients with  other medical conditions or who take certain common medications, allergy shots may be of risk. It is important to mention other medications you talk to your allergist.   When Will I Feel Better?  Some may experience decreased allergy symptoms during the build-up phase. For others, it may take as long as 12 months on the maintenance dose. If there is no improvement after a year of maintenance, your allergist will discuss other treatment options with you.  If you aren't responding to allergy shots, it may be because there is not enough dose of the allergen in your vaccine or there are missing allergens that were not identified during your allergy testing. Other reasons could be that there are high levels of the allergen in your environment or major exposure to non-allergic triggers like tobacco smoke.  What Is the Length of Treatment?  Once the maintenance dose is reached, allergy shots are generally continued for three to five years. The decision to stop should be discussed with your allergist at that time. Some people may experience a permanent reduction of allergy symptoms. Others may relapse and a longer course of allergy shots can  be considered.  What Are the Possible Reactions?  The two types of adverse reactions that can occur with allergy shots are local and systemic. Common local reactions include very mild redness and swelling at the injection site, which can happen immediately or several hours after. A systemic reaction, which is less common, affects the entire body or a particular body system. They are usually mild and typically respond quickly to medications. Signs include increased allergy symptoms such as sneezing, a stuffy nose or hives.  Rarely, a serious systemic reaction called anaphylaxis can develop. Symptoms include swelling in the throat, wheezing, a feeling of tightness in the chest, nausea or dizziness. Most serious systemic reactions develop within 30 minutes of allergy shots.  This is why it is strongly recommended you wait in your doctor's office for 30 minutes after your injections. Your allergist is trained to watch for reactions, and his or her staff is trained and equipped with the proper medications to identify and treat them.  Who Should Administer Allergy Shots?  The preferred location for receiving shots is your prescribing allergist's office. Injections can sometimes be given at another facility where the physician and staff are trained to recognize and treat reactions, and have received instructions by your prescribing allergist.

## 2017-06-06 NOTE — Progress Notes (Signed)
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 Seasonal and perennial allergic rhinitis - Testing today showed: trees, weeds, grasses, indoor molds, outdoor molds, dust mites and cockroach - Avoidance measures provided. - Start taking: Zyrtec (cetirizine) 10mg  tablet once daily and Nasacort (triamcinolone) two sprays per nostril daily - You can use an extra dose of the antihistamine, if needed, for breakthrough symptoms.  - Consider nasal saline rinses 1-2 times daily to remove allergens from the nasal cavities as well as help with mucous clearance (this is especially helpful to do before the nasal sprays are given) - Consider allergy shots as a means of long-term control. - Allergy shots "re-train" and "reset" the immune system to ignore environmental allergens and decrease the resulting immune response to those allergens (sneezing, itchy watery eyes, runny nose, nasal congestion, etc).    - Allergy shots improve symptoms in 75-85% of patients.  - We can discuss more at the next appointment if the medications are not working for you.  2. Anaphylactic shock due to food - Testing was positive to: Murphy OilLobster and Oyster - Avoid the above foods for now.  - Testing was negative to [place], Catfish, [place], [place], [place], [place], [place], [place]fish, Shrimp, [place] and [place]  - Since these were negative, you CAN eat other fish and shellfish.  - But beware of the dangers of cross contamination and talk to your waiter about your allergies.  - Cross contamination was thoroughly discussed with Cristal Deerhristopher. - He was very interested in eating other shellfish and the risks of doing this were discussed.  - Training for epinephrine  auto-injectors provided: [employer]viQ (they should call you in 1-2 days to confirm your shipping address)  - In general, peanut, tree nut, and seafood allergies are life-long after age 218 or so.  3. Return in about 6 months (around 12/06/2017).  Subjective:   Bethanie DickerChristopher M Muzyka is a 27 y.o. male presenting today for evaluation of  Chief Complaint  Patient presents with  . food allergy    itchy throat    Bethanie DickerChristopher M Arnesen has a history of the following: Patient Active Problem List   Diagnosis Date Noted  . Hypogonadism in male 04/01/2017  . Hyperglycemia 04/01/2017  . Bilateral plantar fasciitis 04/01/2017  . Anxiety 04/01/2017  . Other chest pain 03/08/2016  . Morbid obesity (HCC) 07/07/2015  . Encounter for well adult exam with abnormal findings 06/09/2015  . Mastalgia 06/02/2015  . Gynecomastia 06/02/2015    History obtained from: chart review and patient.  Bethanie DickerChristopher M Peyser was referred by Corwin LevinsJohn, James W, MD.     Cristal DeerChristopher is a 27 y.o. male presenting for evaluation of food allergies. The history was obtained from the EMR as well as patient.   Cristal DeerChristopher has had problems with shellfish since around 2011. It started with lobster and then started to include other shellfish. He will develop some tingling in his throat which eventually improves upon cessation of eating the shellfish. He will drink some water to speed things along, but he has never needed epinephrine, benadryl, or other medications to treat his symptoms. However, he will continue to test this occasionally, and has tolerated shrimp on a number of occasions, although it did cause the symptoms once.   Similar  reactions started to occur with fin fish within the last 2-3 years. He started having the same reactions including tingling in his throat. He has never had any systemic reactions including hives, wheezing, abdominal pain, or passing out. Again he has never needed any intervention, and symptoms improve over the course  of 15-30 minutes. Lindel otherwise tolerates all of the major food allergens without adverse event.   Sherrel does have itchy watery eyes with contact to pollen. He does have nasal congestion with this. He does not take medications at all instead just removing himself from the situation. He has had allergy symptoms for the majority of his life but but it has gotten worse over time.    Otherwise, there is no history of other atopic diseases, including asthma, drug allergies, stinging insect allergies, or urticaria. There is no significant infectious history. Vaccinations are up to date.    Past Medical History: Patient Active Problem List   Diagnosis Date Noted  . Hypogonadism in male 04/01/2017  . Hyperglycemia 04/01/2017  . Bilateral plantar fasciitis 04/01/2017  . Anxiety 04/01/2017  . Other chest pain 03/08/2016  . Morbid obesity (HCC) 07/07/2015  . Encounter for well adult exam with abnormal findings 06/09/2015  . Mastalgia 06/02/2015  . Gynecomastia 06/02/2015    Medication List:  Allergies as of 06/06/2017      Reactions   Shellfish Allergy Itching   Throat itching      Medication List        Accurate as of 06/06/17  8:24 PM. Always use your most recent med list.          AUVI-Q 0.3 mg/0.3 mL Soaj injection Generic drug:  EPINEPHrine Inject 0.3 mLs (0.3 mg total) into the muscle once for 1 dose.   clomiPRAMINE 50 MG capsule Commonly known as:  ANAFRANIL Take 50 mg by mouth every other day.   Vitamin D (Ergocalciferol) 50000 units Caps capsule Commonly known as:  DRISDOL Take 1 capsule (50,000 Units total) by mouth every 7 (seven) days.       Birth History: non-contributory.  Developmental History: non-contributory.   Past Surgical History: Past Surgical History:  Procedure Laterality Date  . MULTIPLE TOOTH EXTRACTIONS       Family History: Family History  Problem Relation Age of Onset  . Heart disease Brother   . [name] Parkinson White  syndrome Brother   . Diabetes Brother   . Healthy Mother   . Healthy Father   . Diabetes Unknown        mother side of family  . Other Neg Hx        gynecomastia     Social History: Kie lives at home with his wife and step son, who is 22 years old. They live in a house that is 27 year old. There is carpeting throughout the home. They have gas heating with central cooling. There are dogs inside of the home. There are dust mite coverings on the bedding and pillows. There is no smoking exposure in the home. 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 He is exposed to quite a bit of fumes and dust.     Review of Systems: a 14-point review of systems is pertinent for what is mentioned in HPI.  Otherwise, all other systems were negative. Constitutional: negative other than that listed in the HPI  Eyes: negative other than that listed in the HPI Ears, nose, mouth, throat, and face: negative other than that listed in the HPI Respiratory: negative other than that listed in the HPI Cardiovascular: negative other than that listed in the HPI Gastrointestinal: negative other than that listed in the HPI Genitourinary: negative other than that listed in the HPI Integument: negative other than that listed in the HPI Hematologic: negative other than that listed in the HPI Musculoskeletal: negative other than that listed in the HPI Neurological: negative other than that listed in the HPI Allergy/Immunologic: negative other than that listed in the HPI    Objective:   Blood pressure 136/80, pulse 76, temperature 97.8 F (36.6 C), temperature source Oral, resp. rate 20, height 6' (1.829 m), weight (!) 318 lb 6.4 oz (144.4 kg). Body mass index is 43.18 kg/m.   Physical Exam:  General: Alert, interactive, in no acute distress. Pleasant boisterous  male. Eyes: No conjunctival injection bilaterally, no discharge on the right, no discharge on the left and no Horner-Trantas dots present. PERRL bilaterally. EOMI without pain. No photophobia.  Ears: Right TM pearly gray with normal light reflex, Left TM pearly gray with normal light reflex, Right TM intact without perforation and Left TM intact without perforation.  Nose/Throat: External nose within normal limits, nasal crease present and septum midline. Turbinates markedly edematous and pale with clear discharge. Posterior oropharynx erythematous with cobblestoning in the posterior oropharynx. Tonsils 2+ without exudates.  Tongue without thrush. Neck: Supple without thyromegaly. Trachea midline. Adenopathy: no enlarged lymph nodes appreciated in the anterior cervical, occipital, axillary, epitrochlear, inguinal, or popliteal regions. Lungs: Clear to auscultation without wheezing, rhonchi or rales. No increased work of breathing. CV: Normal S1/S2. No murmurs. Capillary refill <2 seconds.  Abdomen: Nondistended, nontender. No guarding or rebound tenderness. Bowel sounds present in all fields and hypoactive  Skin: Warm and dry, without lesions or rashes. Extremities:  No clubbing, cyanosis or edema. Neuro:   Grossly intact. No focal deficits appreciated. Responsive to questions.  Diagnostic studies:    Allergy Studies:   Indoor/Outdoor Percutaneous Adult Environmental Panel: positive to [place] grass, short ragweed, rough pigweed, rough marsh elder, Box elder, [place] cottonwood, maple, Alternaria, Cladosporium, Aspergillus, Drechslera, Fusarium, Phoma, Tricophyton, Dp mites and cockroach. Otherwise negative with adequate controls.  Selected Food Panel: positive to Lobster (4x12) and Oyster (2x2) with adequate controls. Negative to Catfish, [place], [place]out, [place], [place], [place], [place], Shrimp, [place] and [place]   Allergy testing results were read and interpreted by myself, documented by clinical  staff.     Malachi Bonds, MD [facility]

## 2017-06-24 ENCOUNTER — Other Ambulatory Visit: Payer: Self-pay | Admitting: Internal Medicine

## 2017-11-18 ENCOUNTER — Ambulatory Visit: Payer: Self-pay | Admitting: *Deleted

## 2017-11-18 NOTE — Telephone Encounter (Signed)
Pt called because he wasted diesel fuel on himself; he said that he rinsed the fuel off with water and also showered; he also says that he had a cut on his right index finger, cut on left thumb,and right foot before this incident; Poison control contacted; spoke with Rose; she is most concerned with blisters due to chemical exposure; the pt was also given the number for the national poison control center [phone]; Rose also instructs the pt to call back if he has blistering and burns; he verbalizes understanding; will also route to office for notification of this encounter. Reason for Disposition . Triager unable to answer question  Answer Assessment - Initial Assessment Questions 1. SUBSTANCE: "What was swallowed?" If necessary, have the caller look at the label on the container.    no 2. AMOUNT: "How much was swallowed?" (Err on the side of recording the maximal amount that is missing)      none 3. ONSET: "When was it probably swallowed?" (Minutes or hours ago)      n/a 4. SYMPTOMS: "Do you have any symptoms?" If so, ask: "What are they?" (e.g., abdominal pain, vomiting, weakness)      no 5. SUICIDAL: "Did you take this to hurt or kill yourself?"     no 6. PREGNANCY: "Is there any chance you are pregnant?" "When was your last menstrual period?"     n/a  Protocols used: POISONING-A-AH

## 2017-11-28 ENCOUNTER — Ambulatory Visit: Payer: PRIVATE HEALTH INSURANCE | Admitting: Allergy & Immunology

## 2018-02-20 ENCOUNTER — Ambulatory Visit: Payer: PRIVATE HEALTH INSURANCE | Admitting: Allergy & Immunology

## 2018-04-03 ENCOUNTER — Ambulatory Visit: Payer: PRIVATE HEALTH INSURANCE | Admitting: Allergy & Immunology

## 2018-04-28 ENCOUNTER — Ambulatory Visit: Payer: PRIVATE HEALTH INSURANCE | Admitting: Allergy & Immunology

## 2018-05-15 ENCOUNTER — Ambulatory Visit (INDEPENDENT_AMBULATORY_CARE_PROVIDER_SITE_OTHER): Payer: PRIVATE HEALTH INSURANCE | Admitting: Allergy & Immunology

## 2018-05-15 ENCOUNTER — Encounter: Payer: Self-pay | Admitting: Allergy & Immunology

## 2018-05-15 ENCOUNTER — Other Ambulatory Visit: Payer: Self-pay

## 2018-05-15 DIAGNOSIS — J302 Other seasonal allergic rhinitis: Secondary | ICD-10-CM | POA: Diagnosis not present

## 2018-05-15 DIAGNOSIS — T7800XD Anaphylactic reaction due to unspecified food, subsequent encounter: Secondary | ICD-10-CM

## 2018-05-15 DIAGNOSIS — J3089 Other allergic rhinitis: Secondary | ICD-10-CM

## 2018-05-15 DIAGNOSIS — T7800XA Anaphylactic reaction due to unspecified food, initial encounter: Secondary | ICD-10-CM | POA: Insufficient documentation

## 2018-05-15 NOTE — Patient Instructions (Addendum)
1. Seasonal and perennial allergic rhinitis (trees, weeds, grasses, indoor molds, outdoor molds, dust mites and cockroach) - I think that it would help to take the cetirizine on a daily basis.  - The cetirizine would help prevent the eye itching. - We will also send in a prescription for an eye drop to use daily during the worst times of the year: Pazeo one drop per eye daily - Consider allergy shots as a means of long-term control. - Allergy shots "re-train" and "reset" the immune system to ignore environmental allergens and decrease the resulting immune response to those allergens (sneezing, itchy watery eyes, runny nose, nasal congestion, etc).     2. 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 - Testing was negative on the skin to [place], [place], [place], [place], [place], [place], [place], [place], [place], [place] and [place]  - If the labs are negative as well, you can introduce these at home in a safe environment. - You need to be vigilant about telling people at restaurants about your allergies so that they use different utensils to handle certain foods and avoid the risk of cross contamination. - I am unsure how to explain the reaction to shrimp when you are preparing it, but it might have something to do with cross reactivity of the tropomysins (present on dust mites AND shellfish). - Use gloves to prepare the seafood and avoid topical reactions in the future.  - We will refill the AuviQ today.  3. Return in about 1 year (around 05/15/2019). This can be an in-person, a virtual Webex or a telephone follow up visit.   Please inform us of any Emergency Department visits, hospitalizations, or changes in symptoms. Call us before going to the ED for breathing or allergy symptoms since we might be able to fit you in for a sick visit. Feel free to contact us anytime with any questions, problems, or concerns.  It was a pleasure to  talk to you today today!  Websites that have reliable patient information: 1. American Academy of Asthma, Allergy, and Immunology: www.aaaai.org 2. Food Allergy Research and Education (FARE): foodallergy.org 3. Mothers of Asthmatics: http://www.asthmacommunitynetwork.org 4. American College of Allergy, Asthma, and Immunology: www.acaai.org  "Like" [place] on Facebook and Instagram for our latest updates!      Make sure you are registered to vote! If you have moved or changed any of your contact information, you will need to get this updated before voting!    Voter ID laws are NOT going into effect for the General Election in November 2020! DO NOT let this stop you from exercising your right to vote!

## 2018-05-15 NOTE — Progress Notes (Signed)
RE: Dale Harrell MRN: [id number] DOB: 1990-06-12 Date of Telemedicine Visit: 05/15/2018  Referring provider: Corwin Levins, MD Primary care provider: Corwin Levins, MD  Chief Complaint: Allergic Rhinitis  (wants to know what he is allergic to)   Telemedicine Follow Up Visit via Telephone: I connected with Dale Harrell for a follow up on 05/15/18 by telephone and verified that I am speaking with the correct person using two identifiers.   I discussed the limitations, risks, security and privacy concerns of performing an evaluation and management service by telephone and the availability of in person appointments. I also discussed with the patient that there may be a patient responsible charge related to this service. The patient expressed understanding and agreed to proceed.   Patient is at work.  Provider is at the office.  Visit start time: 5:21 PM Visit end time: 5:40 PM Insurance consent/check in by: [employer] consent and medical assistant/nurse: Trina  History of Present Illness:  He is a 28 y.o. male, who is being followed for environmental allergies and food allergies. His previous allergy office visit was in April 2019 with Dr. Dellis Anes.  At that time, he had testing that was positive to trees, weeds, grasses, indoor and outdoor molds, dust mite, and cockroach.  We started Zyrtec as well as Nasacort.  We did discuss allergen immunotherapy.  He had testing that was positive to lobster and oyster.  We did provide an Auvi-Q epinephrine autoinjector.  He has been doing fairly well.  He has been fortunate to be able to have special masks at work which came out a lot of the dust in the pollens.  He is not using his Nasacort or his cetirizine on a routine basis.  He has not needed any antibiotics since last visit.  He does report that he is having some issues with his contacts during the pollen season.  Allergic Rhinitis Symptom History: He has been doing fairly good.  He does report that he has problem with the pollen getting under his contacts. He has not tried any over the counter eye drops. Sometimes he does have some problems with throat itching when he is around the pollen. He does not take cetirizine or the Nasacort.    Food Allergy Symptom History: He is wanting to eat some crab legs. He has been eating shrimp and he has some stomach pain. He actually does fine when he eats shrimp baked in the oven. He continues to avoid lobster and oyster. He does have his EpiPen but he thinks that he needs refill.  He also has troubles sometimes when he prepares fin fish, but he can eat it without any problems at all.  He does not use gloves when he prepares fish.  He is not great about talking to weight staff about his food allergies.  Otherwise, there have been no changes to his past medical history, surgical history, family history, or social history. 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 He is exposed to quite a bit of fumes and dust.   Assessment and Plan:  Dale Harrell is a 28 y.o. 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 Seasonal and perennial allergic rhinitis (trees, weeds,  grasses, indoor molds, outdoor molds, dust mites and cockroach) - I think that it would help to take the cetirizine on a daily basis.  - The cetirizine would help prevent the eye itching. - We will also send in a prescription for an eye drop to use daily during the worst times of the year: Pazeo one drop per eye daily - Consider allergy shots as a means of long-term control. - Allergy shots "re-train" and "reset" the immune system to ignore environmental allergens and decrease the resulting  immune response to those allergens (sneezing, itchy watery eyes, runny nose, nasal congestion, etc).     2. 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 - Testing was negative on the skin to [place], [place], [place], [place], [place], [place], [place], [place], [place], [place] and [place]  - If the labs are negative as well, you can introduce these at home in a safe environment. - You need to be vigilant about telling people at restaurants about your allergies so that they use different utensils to handle certain foods and avoid the risk of cross contamination. - I am unsure how to explain the reaction to shrimp when you are preparing it, but it might have something to do with cross reactivity of the tropomysins (present on dust mites AND shellfish). - Use gloves to prepare the seafood and avoid topical reactions in the future.  - We will refill the AuviQ today.  3. Return in about 1 year (around 05/15/2019). This can be an in-person, a virtual Webex or a telephone follow up visit.  Diagnostics: None.  Medication List:  Current Outpatient Medications  Medication Sig Dispense Refill  . Vitamin D, Ergocalciferol, (DRISDOL) 50000 units CAPS capsule Take 1 capsule (50,000 Units total) by mouth every 7 (seven) days. (Patient taking differently: Take 2,000 Units by mouth every 7 (seven) days. ) 12 capsule 0   No current facility-administered medications for this visit.    Allergies: Allergies  Allergen Reactions  . Shellfish Allergy Itching    Throat itching   I reviewed his past medical history, social history, family history, and environmental history and no significant changes have been reported from previous visits.  Review of Systems  Constitutional: Negative for chills, diaphoresis, fatigue and fever.  HENT: Negative for congestion, ear discharge, ear pain, facial swelling, postnasal drip, rhinorrhea, sinus pressure, sinus  pain and sore throat.   Eyes: Negative for pain, discharge and itching.  Respiratory: Negative for apnea, cough, chest tightness and shortness of breath.   Cardiovascular: Negative for chest pain.  Gastrointestinal: Negative for diarrhea and nausea.  Musculoskeletal: Negative for arthralgias and myalgias.  Skin: Negative for rash.  Allergic/Immunologic: Positive for environmental allergies and food allergies.    Objective:  Physical exam not obtained as encounter was done via telephone.   Previous notes and tests were reviewed.  I discussed the assessment and treatment plan with the patient. The patient was provided an opportunity to ask questions and all were answered. The patient agreed with the plan and demonstrated an understanding of the instructions.   The patient was advised to call back or seek an in-person evaluation if the symptoms worsen or if the condition fails to improve as anticipated.  I provided 19 minutes of non-face-to-face time during this encounter.  It was my pleasure to participate in Dale Harrell's care today. Please feel free to contact me with any questions or concerns.   Sincerely,  Valentina Shaggy, MD

## 2018-05-15 NOTE — Progress Notes (Signed)
Start time:  41 Finish Time:  1740 Where are you located:  work Do you give [place] permission to bill your insurance:  yes Are you signed up for my chart:  Yes  Pt states that when he eats shrimp, he has throat tightness.  Having issues with his eyes feels like he has something in them all the time.  Also, wears contacts.

## 2018-05-16 ENCOUNTER — Encounter: Payer: Self-pay | Admitting: Allergy & Immunology

## 2018-05-16 ENCOUNTER — Telehealth: Payer: Self-pay

## 2018-05-16 MED ORDER — OLOPATADINE HCL 0.1 % OP SOLN: 1.0000 [drp] | Freq: Two times a day (BID) | OPHTHALMIC | 12 refills | Status: DC

## 2018-05-16 MED ORDER — OLOPATADINE HCL 0.7 % OP SOLN: 1.0000 [drp] | OPHTHALMIC | 5 refills | Status: DC

## 2018-05-16 MED ORDER — EPINEPHRINE 0.3 MG/0.3ML IJ SOAJ: 0.3000 mg | Freq: Once | INTRAMUSCULAR | 2 refills | Status: AC

## 2018-05-16 NOTE — Telephone Encounter (Signed)
Patient's insurance will not cover Pazeo. Can we send in Pataday or Patanol? Please advise and thank you.

## 2018-05-16 NOTE — Telephone Encounter (Signed)
Sure let's do Patanol 1 drop per eye twice daily as needed.  Malachi Bonds, MD [facility]

## 2018-05-16 NOTE — Addendum Note (Signed)
Addended by: Teressa Senter on: 05/16/2018 02:27 PM   Modules accepted: Orders

## 2018-06-22 LAB — ALLERGY PANEL 19, SEAFOOD GROUP
Allergen Salmon IgE: 0.12 kU/L — AB
Catfish: <0.1 kU/L
Codfish IgE: <0.1 kU/L
F023-IgE Crab: 0.18 kU/L — AB
F080-IgE Lobster: <0.1 kU/L
Shrimp IgE: 4.38 kU/L — AB
Tuna: <0.1 kU/L

## 2018-06-22 LAB — ALLERGEN PROFILE, SHELLFISH
Clam IgE: <0.1 kU/L
F290-IgE Oyster: <0.1 kU/L
Scallop IgE: <0.1 kU/L

## 2018-07-02 ENCOUNTER — Telehealth: Payer: Self-pay

## 2018-07-02 MED ORDER — EPINEPHRINE 0.3 MG/0.3ML IJ SOAJ: 0.3000 mg | Freq: Once | INTRAMUSCULAR | 2 refills | Status: AC

## 2018-07-02 MED ORDER — EPINEPHRINE 0.3 MG/0.3ML IJ SOAJ: 0.3000 mg | INTRAMUSCULAR | 2 refills | Status: DC | PRN

## 2018-07-02 NOTE — Telephone Encounter (Signed)
Pt was calling to have his lab tests results explained. Explained lab test results, pt verbalized understanding, will call back if he has any other questions.  Needs new epipens, the other ones he has, has expired.

## 2020-05-19 ENCOUNTER — Telehealth: Payer: Self-pay

## 2020-05-19 NOTE — Telephone Encounter (Signed)
Patient is requesting to [facility] from DR. Oliver Barre to Dr. Hassan Rowan.  Can I schedule this patient?

## 2020-05-20 NOTE — Telephone Encounter (Signed)
ok 

## 2020-05-20 NOTE — Telephone Encounter (Signed)
LMVM for the patient to contact the office to schedule a TOC appointment with Dr. Hassan Rowan

## 2020-05-20 NOTE — Telephone Encounter (Signed)
The patient was needing a sooner appointment to [facility] and he was hoping to get his A1C checked or possibly a CPE.  Can we work him in sooner?

## 2020-05-23 NOTE — Telephone Encounter (Signed)
Looks like I have a 2 oclock open on weds

## 2020-05-23 NOTE — Telephone Encounter (Signed)
Is there another day that we can schedule this patient?

## 2020-05-23 NOTE — Telephone Encounter (Signed)
If he is feeling he needs to be seen sooner; we could use a same day slot so that we can meet and get some bloodwork caught up, but because of my part time schedule and making sure I am keeping spots open for established patients, I cannot do a physical sooner.

## 2020-05-24 NOTE — Telephone Encounter (Signed)
I have the patient scheduled for a TOC on 04/27 at 10:30 AM  CPE is scheduled for 07/22/2020 at 1 PM

## 2020-06-08 ENCOUNTER — Encounter: Payer: Self-pay | Admitting: Family Medicine

## 2020-06-08 ENCOUNTER — Ambulatory Visit: Payer: PRIVATE HEALTH INSURANCE | Admitting: Family Medicine

## 2020-06-08 ENCOUNTER — Other Ambulatory Visit: Payer: Self-pay

## 2020-06-08 ENCOUNTER — Ambulatory Visit (INDEPENDENT_AMBULATORY_CARE_PROVIDER_SITE_OTHER): Payer: PRIVATE HEALTH INSURANCE

## 2020-06-08 VITALS — BP 116/70 | HR 76 | Temp 98.2°F | Ht 72.0 in | Wt 298.9 lb

## 2020-06-08 DIAGNOSIS — G8929 Other chronic pain: Secondary | ICD-10-CM

## 2020-06-08 DIAGNOSIS — M25561 Pain in right knee: Secondary | ICD-10-CM

## 2020-06-08 DIAGNOSIS — I8393 Asymptomatic varicose veins of bilateral lower extremities: Secondary | ICD-10-CM

## 2020-06-08 DIAGNOSIS — R739 Hyperglycemia, unspecified: Secondary | ICD-10-CM

## 2020-06-08 DIAGNOSIS — R7989 Other specified abnormal findings of blood chemistry: Secondary | ICD-10-CM

## 2020-06-08 DIAGNOSIS — M25512 Pain in left shoulder: Secondary | ICD-10-CM

## 2020-06-08 DIAGNOSIS — M25511 Pain in right shoulder: Secondary | ICD-10-CM | POA: Diagnosis not present

## 2020-06-08 DIAGNOSIS — R252 Cramp and spasm: Secondary | ICD-10-CM

## 2020-06-08 DIAGNOSIS — J302 Other seasonal allergic rhinitis: Secondary | ICD-10-CM

## 2020-06-08 DIAGNOSIS — E785 Hyperlipidemia, unspecified: Secondary | ICD-10-CM

## 2020-06-08 DIAGNOSIS — F419 Anxiety disorder, unspecified: Secondary | ICD-10-CM

## 2020-06-08 DIAGNOSIS — J3089 Other allergic rhinitis: Secondary | ICD-10-CM

## 2020-06-08 DIAGNOSIS — N62 Hypertrophy of breast: Secondary | ICD-10-CM

## 2020-06-08 DIAGNOSIS — E559 Vitamin D deficiency, unspecified: Secondary | ICD-10-CM

## 2020-06-08 DIAGNOSIS — Z91018 Allergy to other foods: Secondary | ICD-10-CM

## 2020-06-08 DIAGNOSIS — E1169 Type 2 diabetes mellitus with other specified complication: Secondary | ICD-10-CM

## 2020-06-08 LAB — COMPREHENSIVE METABOLIC PANEL
ALT: 34 U/L (ref 0–53)
AST: 22 U/L (ref 0–37)
Albumin: 4.5 g/dL (ref 3.5–5.2)
Alkaline Phosphatase: 56 U/L (ref 39–117)
BUN: 13 mg/dL (ref 6–23)
CO2: 25 mEq/L (ref 19–32)
Calcium: 9.4 mg/dL (ref 8.4–10.5)
Chloride: 102 mEq/L (ref 96–112)
Creatinine, Ser: 0.96 mg/dL (ref 0.40–1.50)
GFR: 106.31 mL/min (ref >60.00)
Glucose, Bld: 90 mg/dL (ref 70–99)
Potassium: 4 mEq/L (ref 3.5–5.1)
Sodium: 137 mEq/L (ref 135–145)
Total Bilirubin: 1 mg/dL (ref 0.2–1.2)
Total Protein: 7.1 g/dL (ref 6.0–8.3)

## 2020-06-08 LAB — CBC WITH DIFFERENTIAL/PLATELET
Basophils Absolute: 0 10*3/uL (ref 0.0–0.1)
Basophils Relative: 0.2 % (ref 0.0–3.0)
Eosinophils Absolute: 0.1 10*3/uL (ref 0.0–0.7)
Eosinophils Relative: 1.2 % (ref 0.0–5.0)
HCT: 41 % (ref 39.0–52.0)
Hemoglobin: 13.7 g/dL (ref 13.0–17.0)
Lymphocytes Relative: 55.7 % — ABNORMAL HIGH (ref 12.0–46.0)
Lymphs Abs: 2.6 10*3/uL (ref 0.7–4.0)
MCHC: 33.4 g/dL (ref 30.0–36.0)
MCV: 79.6 fl (ref 78.0–100.0)
Monocytes Absolute: 0.5 10*3/uL (ref 0.1–1.0)
Monocytes Relative: 10.3 % (ref 3.0–12.0)
Neutro Abs: 1.5 10*3/uL (ref 1.4–7.7)
Neutrophils Relative %: 32.6 % — ABNORMAL LOW (ref 43.0–77.0)
Platelets: 248 10*3/uL (ref 150.0–400.0)
RBC: 5.15 Mil/uL (ref 4.22–5.81)
RDW: 13.1 % (ref 11.5–15.5)
WBC: 4.6 10*3/uL (ref 4.0–10.5)

## 2020-06-08 LAB — LIPID PANEL
Cholesterol: 131 mg/dL (ref 0–200)
HDL: 47.7 mg/dL (ref >39.00)
LDL Cholesterol: 65 mg/dL (ref 0–99)
NonHDL: 83.14
Total CHOL/HDL Ratio: 3
Triglycerides: 92 mg/dL (ref 0.0–149.0)
VLDL: 18.4 mg/dL (ref 0.0–40.0)

## 2020-06-08 LAB — VITAMIN D 25 HYDROXY (VIT D DEFICIENCY, FRACTURES): VITD: 37.72 ng/mL (ref 30.00–100.00)

## 2020-06-08 LAB — TESTOSTERONE: Testosterone: 258.91 ng/dL — ABNORMAL LOW (ref 300.00–890.00)

## 2020-06-08 LAB — FERRITIN: Ferritin: 168 ng/mL (ref 22.0–322.0)

## 2020-06-08 LAB — TSH: TSH: 1.85 u[IU]/mL (ref 0.35–4.50)

## 2020-06-08 LAB — VITAMIN B12: Vitamin B-12: 417 pg/mL (ref 211–911)

## 2020-06-08 LAB — HEMOGLOBIN A1C: Hgb A1c MFr Bld: 5.9 % (ref 4.6–6.5)

## 2020-06-08 MED ORDER — EPINEPHRINE 0.3 MG/0.3ML IJ SOAJ: 0.3000 mg | INTRAMUSCULAR | 2 refills | Status: DC | PRN

## 2020-06-08 NOTE — Patient Instructions (Signed)
*  consider compression stockings - look on [employer] nursing supply or [employer] (careful with picking one that has good reviews). You want 20+ mmHg compression.

## 2020-06-08 NOTE — Progress Notes (Signed)
Dale Harrell DOB: 07-25-1990 Encounter date: 06/08/2020  This isa 30 y.o. male who presents to establish care. Chief Complaint  Patient presents with  . Establish Care    History of present illness: A1C was elevated to 6.4 at one time; would like to get this checked.   Also having some knee pain - right knee. Feels pain when going up stairs; doing lunge. Was playing football at job and came down funny way. Pain on medial aspect of knee. 2 weeks ago. Better, but not back to normal. Doesn't hurt with regular walking no swelling. No bruising.   Was going to [facility] physician prior to coming here, but they didn't accept his insurance. Last bloodwork was in November.   He is a Naval architect.   Seasonal allergies - bothers him occasionally; doesn't take anything for this. Ate regular fish recently and had some throat itching. He is nervous to eat fish. Used to just be shellfish that bothered him. Can't eat kiwi, tunafish (gets itchy).   Does have some varicosities around ankles; not getting sore. No swelling in ankles.   Anxiety - not as bad as it used to be. Never been on medication. Worked through this on own - read books, watched videos.   Left shoulder has bothered him for years. Not sure if injured in past. Bothers more with working out. Better after. Any activity that pushes weight - benching. Lateral pain with lifting. No weakness.   Does feel there has been improvement in breast tissue enlargement.  Sexual drive is good. Sexual function is good. States this is big improvement from previous. He quit drinking alcohol (was drinking heavily regularly); now just rare glass of wine. Also quit smoking. Feels like both of these helped him.   Has 60 year old daughter. 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   Past Medical History:  Diagnosis Date  . Anxiety   . Chicken pox   . Hyperlipidemia    . Migraine    Not since 30 years of age.   Past Surgical History:  Procedure Laterality Date  . MULTIPLE TOOTH EXTRACTIONS     Allergies  Allergen Reactions  . Shellfish Allergy Itching    Throat itching   Current Meds  Medication Sig  . MAGNESIUM PO Take by mouth daily. As needed for cramps  . Multiple Vitamin (MULTIVITAMIN) tablet Take 1 tablet by mouth daily.  Marland Kitchen OVER THE COUNTER MEDICATION Sea moss  . [DISCONTINUED] EPINEPHrine (AUVI-Q) 0.3 mg/0.3 mL IJ SOAJ injection Inject 0.3 mLs (0.3 mg total) into the muscle as needed for anaphylaxis.   Social History   Tobacco Use  . Smoking status: Former Smoker    Packs/day: 1.00    Types: Cigars    Quit date: 09/01/2016    Years since quitting: 3.7  . Smokeless tobacco: Never Used  Substance Use Topics  . Alcohol use: Not Currently    Alcohol/week: 0.0 standard drinks   Family History  Problem Relation Age of Onset  . [name] Parkinson White syndrome Brother   . Healthy Mother   . Healthy Father   . Cancer Maternal Grandmother        patient uncertain type  . Diabetes Other        mother side of family  . Diabetes Sister   . Other Neg Hx        gynecomastia  Review of Systems  Constitutional: Negative for chills, fatigue and fever.  Respiratory: Negative for cough, chest tightness, shortness of breath and wheezing.   Cardiovascular: Negative for chest pain, palpitations and leg swelling.  Musculoskeletal: Positive for arthralgias (shoulder bilat; right knee pain).    Objective:  BP 116/70 (BP Location: Left Arm, Patient Position: Sitting, Cuff Size: Large)   Pulse 76   Temp 98.2 F (36.8 C) (Oral)   Ht 6' (1.829 m)   Wt 298 lb 14.4 oz (135.6 kg)   SpO2 99%   BMI 40.54 kg/m   Weight: 298 lb 14.4 oz (135.6 kg)   BP Readings from Last 3 Encounters:  06/08/20 116/70  06/06/17 136/80  04/01/17 124/82   Wt Readings from Last 3 Encounters:  06/08/20 298 lb 14.4 oz (135.6 kg)  06/06/17 (!) 318 lb 6.4 oz  (144.4 kg)  04/01/17 (!) 318 lb (144.2 kg)    Physical Exam Constitutional:      General: He is not in acute distress.    Appearance: He is well-developed.  Cardiovascular:     Rate and Rhythm: Normal rate and regular rhythm.     Heart sounds: Normal heart sounds. No murmur heard. No friction rub.  Pulmonary:     Effort: Pulmonary effort is normal. No respiratory distress.     Breath sounds: Normal breath sounds. No wheezing or rales.  Musculoskeletal:     Right lower leg: No edema.     Left lower leg: No edema.     Comments: Lateral shoulder tenderness to palpation. Mild pain with hawkins; otherwise good strength bilaterall. No limited ROM.   Crepitance right knee; grinding with patellar motion. No lateral or medial jlt or instability. No pain with pressure on lat/med meniscus. No edema.   Skin:    Comments: Varicosities bilat lower extremities  Neurological:     Mental Status: He is alert and oriented to person, place, and time.  Psychiatric:        Behavior: Behavior normal.     Assessment/Plan:  1. Seasonal and perennial allergic rhinitis Controlled without regular medication.   2. Anxiety Controlled without medication.   3. Multiple food allergies Uncertain all of allergies - seems to be having more food sensitivities. Will start some bloodwork but refer to allergy for more extensive testing.  - EPINEPHrine (AUVI-Q) 0.3 mg/0.3 mL IJ SOAJ injection; Inject 0.3 mg into the muscle as needed for anaphylaxis.  Dispense: 4 each; Refill: 2 - Allergen Profile, Shellfish; Future - Allergen Profile, Food-Fish; Future - Allergen Profile, Food-Fruit; Future - Ambulatory referral to Allergy; Future  4. Hyperglycemia Has been working on exercise, weight loss, and has made healthy dietary changes.  - Hemoglobin A1c; Future - Hemoglobin A1c  5. Gynecomastia Feels this has improved. Working on weight loss; muscle building.   6. Muscle cramps - CBC with  Differential/Platelet; Future - Magnesium, RBC; Future - TSH; Future - Vitamin B12; Future - Ferritin; Future - Ferritin - Vitamin B12 - TSH - Magnesium, RBC - CBC with Differential/Platelet  7. Varicose veins of both lower extremities, unspecified whether complicated Encouraged consideration for compression stockings while driving/sitting prolonged periods. Offered vasc referral for treatment if desired.   8. Vitamin D deficiency - VITAMIN D 25 Hydroxy (Vit-D Deficiency, Fractures); Future - VITAMIN D 25 Hydroxy (Vit-D Deficiency, Fractures)  9. Hyperlipidemia associated with type 2 diabetes mellitus (HCC) - Comprehensive metabolic panel; Future - Lipid panel; Future - Lipid panel - Comprehensive metabolic panel  10. Low testosterone -  Testosterone; Future - Testosterone  11. Acute pain of right knee Start with xray; pain has been improving but he is concerned that not resolved. If normal; ok to monitor another couple of weeks with continued gentle exercise.  - DG Knee Complete 4 Views Right; Future  12. Chronic pain of both shoulders Will start with xray; consider ortho eval due to chronic issue. May benefit from PT for specific exercises for strengthening. - DG Shoulder Right; Future  Return in about 3 months (around 09/07/2020) for physical exam.  Theodis Shove, MD  58 minutes spent in chart review with patient - history obtaining, review of chronic conditions, physical, charting.

## 2020-06-10 ENCOUNTER — Telehealth: Payer: Self-pay | Admitting: Family Medicine

## 2020-06-10 LAB — MAGNESIUM, RBC: Magnesium RBC: 5.5 mg/dL (ref 4.0–6.4)

## 2020-06-10 NOTE — Telephone Encounter (Signed)
Pt did not understand the results of his  X-ray results pt would like the nurse to call to go over them with him

## 2020-06-10 NOTE — Telephone Encounter (Signed)
See results note. 

## 2020-06-17 NOTE — Addendum Note (Signed)
Addended by: Johnella Moloney on: 06/17/2020 11:45 AM   Modules accepted: Orders

## 2020-06-22 ENCOUNTER — Ambulatory Visit: Payer: PRIVATE HEALTH INSURANCE | Admitting: Family Medicine

## 2020-07-22 ENCOUNTER — Ambulatory Visit: Payer: PRIVATE HEALTH INSURANCE | Admitting: Family Medicine

## 2020-07-22 ENCOUNTER — Encounter: Payer: PRIVATE HEALTH INSURANCE | Admitting: Family Medicine

## 2020-07-27 ENCOUNTER — Telehealth (INDEPENDENT_AMBULATORY_CARE_PROVIDER_SITE_OTHER): Payer: PRIVATE HEALTH INSURANCE | Admitting: Family Medicine

## 2020-07-27 ENCOUNTER — Other Ambulatory Visit: Payer: Self-pay

## 2020-07-27 DIAGNOSIS — R109 Unspecified abdominal pain: Secondary | ICD-10-CM

## 2020-07-27 MED ORDER — CIPROFLOXACIN HCL 500 MG PO TABS: 500.0000 mg | ORAL_TABLET | Freq: Two times a day (BID) | ORAL | 0 refills | Status: DC

## 2020-07-27 NOTE — Progress Notes (Signed)
Patient ID: Dale Harrell, male   DOB: 1990-03-18, 30 y.o.   MRN: [id number]  This visit type was conducted due to national recommendations for restrictions regarding the COVID-19 pandemic in an effort to limit this patient's exposure and mitigate transmission in our community.   Virtual Visit via Video Note  I connected with Luan Pulling on 07/27/20 at  5:00 PM EDT by a video enabled telemedicine application and verified that I am speaking with the correct person using two identifiers.  Location patient: home Location provider:work or home office Persons participating in the virtual visit: patient, provider  I discussed the limitations of evaluation and management by telemedicine and the availability of in person appointments. The patient expressed understanding and agreed to proceed.   HPI:  Dale Harrell has history of shellfish allergy.  He had recent allergy testing that confirmed this.  This past Sunday he ate some [place] (fish) along with baked beans and coleslaw.  He developed some subsequent abdominal pain and nausea without vomiting.  Monday he took some Alka-Seltzer plus without improvement.  Tuesday he drank some ginger tea which seemed to settle things a bit.  He had some intermittent cramping sensation upper abdomen Wednesday.  No diarrhea.  His main concern is intermittent cramp-like abdominal pains which come and go.  Appetite is fair.  He has had some recent constipation issues as well.  No recent travels.   ROS: See pertinent positives and negatives per HPI.  Past Medical History:  Diagnosis Date   Anxiety    Chicken pox    Hyperlipidemia    Migraine    Not since 30 years of age.    Past Surgical History:  Procedure Laterality Date   MULTIPLE TOOTH EXTRACTIONS      Family History  Problem Relation Age of Onset   Evelene Harrell Parkinson White syndrome Brother    Healthy Mother    Healthy Father    Cancer Maternal Grandmother        patient uncertain type   Diabetes  Other        mother side of family   Diabetes Sister    Other Neg Hx        gynecomastia    SOCIAL HX: Quit smoking 2018   Current Outpatient Medications:    ciprofloxacin (CIPRO) 500 MG tablet, Take 1 tablet (500 mg total) by mouth 2 (two) times daily., Disp: 6 tablet, Rfl: 0   EPINEPHrine (AUVI-Q) 0.3 mg/0.3 mL IJ SOAJ injection, Inject 0.3 mg into the muscle as needed for anaphylaxis., Disp: 4 each, Rfl: 2   MAGNESIUM PO, Take by mouth daily. As needed for cramps, Disp: , Rfl:    Multiple Vitamin (MULTIVITAMIN) tablet, Take 1 tablet by mouth daily., Disp: , Rfl:    OVER THE COUNTER MEDICATION, Sea moss, Disp: , Rfl:   EXAM:  VITALS per patient if applicable:  GENERAL: alert, oriented, appears well and in no acute distress  HEENT: atraumatic, conjunttiva clear, no obvious abnormalities on inspection of external nose and ears  NECK: normal movements of the head and neck  LUNGS: on inspection no signs of respiratory distress, breathing rate appears normal, no obvious gross SOB, gasping or wheezing  CV: no obvious cyanosis  MS: moves all visible extremities without noticeable abnormality  PSYCH/NEURO: pleasant and cooperative, no obvious depression or anxiety, speech and thought processing grossly intact  ASSESSMENT AND PLAN:  Discussed the following assessment and plan:  Patient relates 3-day history of intermittent severe cramp-like pains.  He  thinks this is related to food consumed Sunday night.  Denies any bloody stools or fever.  No diarrhea symptoms.  -We recommend he consider MiraLAX 17 g daily as needed for constipation issues -Follow-up immediately for any fever, bloody stools, or worsening abdominal pain. -We elected to go and cover with Cipro 500 mg twice daily for 3 days -be in touch if symptoms not resolving in couple of weeks.      I discussed the assessment and treatment plan with the patient. The patient was provided an opportunity to ask questions and  all were answered. The patient agreed with the plan and demonstrated an understanding of the instructions.   The patient was advised to call back or seek an in-person evaluation if the symptoms worsen or if the condition fails to improve as anticipated.     Dale Peat, MD

## 2020-09-06 ENCOUNTER — Other Ambulatory Visit: Payer: Self-pay

## 2020-09-07 ENCOUNTER — Encounter: Payer: Self-pay | Admitting: Family Medicine

## 2020-09-07 ENCOUNTER — Ambulatory Visit (INDEPENDENT_AMBULATORY_CARE_PROVIDER_SITE_OTHER): Payer: PRIVATE HEALTH INSURANCE | Admitting: Family Medicine

## 2020-09-07 VITALS — BP 102/80 | HR 55 | Temp 97.8°F | Ht 72.25 in | Wt 290.3 lb

## 2020-09-07 DIAGNOSIS — Z0000 Encounter for general adult medical examination without abnormal findings: Secondary | ICD-10-CM

## 2020-09-07 DIAGNOSIS — I459 Conduction disorder, unspecified: Secondary | ICD-10-CM | POA: Diagnosis not present

## 2020-09-07 NOTE — Progress Notes (Signed)
Dale Harrell DOB: December 04, 1990 Encounter date: 09/07/2020  This is a 30 y.o. male who presents for complete physical   History of present illness/Additional concerns: Last visit with me was 06/08/2020 to establish care.  Goal weight is 240-250. Still working out regularly. Shoulders and knees have stopped hurting since he has been exercising regularly.    Sometimes gets irritation under breasts.   Has seen allergist since last visit. Told not allergic to fish. Ended up getting abx, miralax and then abd pain went away. Doesn't usually have issues with constipation.   Following with urology; now on clomid 50mg . Has been doing this for just about 10 days.   Has noted some skipped heart beats. At least a couple of times/week. Notes more at rest or when hot. Does admit to significant caffeine intake and not getting enough sleep. Has noted these for last year or longer. One time noted with exercise about a year ago. No associated chest pain, pressure, shortness of breathe, light headedness, dizziness.   Past Medical History:  Diagnosis Date   Anxiety    Chicken pox    Hyperlipidemia    Migraine    Not since 30 years of age.   Past Surgical History:  Procedure Laterality Date   MULTIPLE TOOTH EXTRACTIONS     Allergies  Allergen Reactions   Shellfish Allergy Itching    Throat itching   Current Meds  Medication Sig   clomiPHENE Citrate (CLOMID PO) Take by mouth daily.   EPINEPHrine (AUVI-Q) 0.3 mg/0.3 mL IJ SOAJ injection Inject 0.3 mg into the muscle as needed for anaphylaxis.   MAGNESIUM PO Take by mouth daily. As needed for cramps   Multiple Vitamin (MULTIVITAMIN) tablet Take 1 tablet by mouth daily.   Omega-3 Fatty Acids (OMEGA 3 PO) Take by mouth daily.   OVER THE COUNTER MEDICATION Sea moss   Social History   Tobacco Use   Smoking status: Former    Packs/day: 1.00    Types: Cigars, Cigarettes    Quit date: 09/01/2016    Years since quitting: 4.0   Smokeless  tobacco: Never  Substance Use Topics   Alcohol use: Not Currently    Alcohol/week: 0.0 standard drinks   Family History  Problem Relation Age of Onset   09/03/2016 Parkinson White syndrome Brother    Healthy Mother    Healthy Father    Cancer Maternal Grandmother        patient uncertain type   Diabetes Other        mother side of family   Diabetes Sister    Other Neg Hx        gynecomastia     Review of Systems  Constitutional:  Negative for activity change, appetite change, chills, fatigue, fever and unexpected weight change.  HENT:  Negative for congestion, ear pain, hearing loss, sinus pressure, sinus pain, sore throat and trouble swallowing.   Eyes:  Negative for pain and visual disturbance.  Respiratory:  Negative for cough, chest tightness, shortness of breath and wheezing.   Cardiovascular:  Positive for palpitations (brief single beat). Negative for chest pain and leg swelling.  Gastrointestinal:  Negative for abdominal distention, abdominal pain, blood in stool, constipation, diarrhea, nausea and vomiting.  Genitourinary:  Negative for decreased urine volume, difficulty urinating, dysuria, penile pain and testicular pain.  Musculoskeletal:  Negative for arthralgias (knee pain has improved), back pain and joint swelling.  Skin:  Negative for rash.  Neurological:  Negative for dizziness, weakness, numbness and  headaches.  Hematological:  Negative for adenopathy. Does not bruise/bleed easily.  Psychiatric/Behavioral:  Negative for agitation, sleep disturbance and suicidal ideas. The patient is not nervous/anxious.    CBC:  Lab Results  Component Value Date   WBC 4.6 06/08/2020   HGB 13.7 06/08/2020   HCT 41.0 06/08/2020   MCHC 33.4 06/08/2020   RDW 13.1 06/08/2020   PLT 248.0 06/08/2020   CMP: Lab Results  Component Value Date   NA 137 06/08/2020   K 4.0 06/08/2020   CL 102 06/08/2020   CO2 25 06/08/2020   GLUCOSE 90 06/08/2020   BUN 13 06/08/2020   CREATININE  0.96 06/08/2020   CALCIUM 9.4 06/08/2020   PROT 7.1 06/08/2020   BILITOT 1.0 06/08/2020   ALKPHOS 56 06/08/2020   ALT 34 06/08/2020   AST 22 06/08/2020   LIPID: Lab Results  Component Value Date   CHOL 131 06/08/2020   TRIG 92.0 06/08/2020   HDL 47.70 06/08/2020   LDLCALC 65 06/08/2020    Objective:  BP 102/80 (BP Location: Left Arm, Patient Position: Sitting, Cuff Size: Large)   Pulse (!) 55   Temp 97.8 F (36.6 C) (Oral)   Ht 6' 0.25" (1.835 m)   Wt 290 lb 4.8 oz (131.7 kg)   SpO2 96%   BMI 39.10 kg/m   Weight: 290 lb 4.8 oz (131.7 kg)   BP Readings from Last 3 Encounters:  09/07/20 102/80  06/08/20 116/70  06/06/17 136/80   Wt Readings from Last 3 Encounters:  09/07/20 290 lb 4.8 oz (131.7 kg)  06/08/20 298 lb 14.4 oz (135.6 kg)  06/06/17 (!) 318 lb 6.4 oz (144.4 kg)    Physical Exam Constitutional:      General: He is not in acute distress.    Appearance: He is well-developed.  HENT:     Head: Normocephalic and atraumatic.     Right Ear: External ear normal.     Left Ear: External ear normal.     Nose: Nose normal.     Mouth/Throat:     Pharynx: No oropharyngeal exudate.  Eyes:     Conjunctiva/sclera: Conjunctivae normal.     Pupils: Pupils are equal, round, and reactive to light.  Neck:     Thyroid: No thyromegaly.  Cardiovascular:     Rate and Rhythm: Normal rate and regular rhythm.     Heart sounds: Normal heart sounds. No murmur heard.   No friction rub. No gallop.  Pulmonary:     Effort: Pulmonary effort is normal. No respiratory distress.     Breath sounds: Normal breath sounds. No stridor. No wheezing or rales.  Abdominal:     General: Bowel sounds are normal.     Palpations: Abdomen is soft.  Musculoskeletal:        General: Normal range of motion.     Cervical back: Neck supple.  Skin:    General: Skin is warm and dry.  Neurological:     Mental Status: He is alert and oriented to person, place, and time.  Psychiatric:         Behavior: Behavior normal.        Thought Content: Thought content normal.        Judgment: Judgment normal.    Assessment/Plan: Health Maintenance Due  Topic Date Due   URINE MICROALBUMIN  Never done   INFLUENZA VACCINE  09/12/2020   Health Maintenance reviewed.  1. Preventative health care He has done a great job with losing weight,  increasing activity, and making healthy lifestyle changes.  Encouraged him to keep up the good work.  2. Skipped heart beats EKG cardiac, but rhythm.  No acute changes.  We discussed limiting caffeine, making sure staying well-hydrated, and monitoring.  If he continues to note palpitations then we can evaluate with Holter monitor.  We also discussed that if he notes skipped beats with exercise or activity, or additional symptoms, we will further evaluate.  Blood work completed at last visit 3 months ago was stable including blood counts, electrolytes, thyroid. - EKG 12-Lead  Return in about 3 months (around 12/08/2020) for Chronic condition visit.  Theodis Shove, MD

## 2020-12-09 ENCOUNTER — Ambulatory Visit: Payer: PRIVATE HEALTH INSURANCE | Admitting: Family Medicine

## 2021-10-10 ENCOUNTER — Ambulatory Visit: Payer: PRIVATE HEALTH INSURANCE | Admitting: Family Medicine

## 2021-10-10 ENCOUNTER — Encounter: Payer: Self-pay | Admitting: Family Medicine

## 2021-10-10 VITALS — BP 122/76 | HR 65 | Temp 98.0°F | Ht 72.25 in | Wt 295.4 lb

## 2021-10-10 DIAGNOSIS — R400 Somnolence: Secondary | ICD-10-CM

## 2021-10-10 DIAGNOSIS — R739 Hyperglycemia, unspecified: Secondary | ICD-10-CM

## 2021-10-10 LAB — POCT GLYCOSYLATED HEMOGLOBIN (HGB A1C): Hemoglobin A1C: 5.6 % (ref 4.0–5.6)

## 2021-10-10 NOTE — Progress Notes (Incomplete)
   Established Patient Office Visit  Subjective   Patient ID: Dale Harrell, male    DOB: 20-Aug-1990  Age: 31 y.o. MRN: [id number]  Chief Complaint  Patient presents with  . Establish Care  . Form Completion    Patient brought in a form be completed for his job    Patient is here for his annual physical.       {History (Optional):23778}  Review of Systems  All other systems reviewed and are negative.     Objective:     BP 122/76 (BP Location: Left Arm, Patient Position: Sitting, Cuff Size: Large)   Pulse 65   Temp 98 F (36.7 C) (Oral)   Ht 6' 0.25" (1.835 m)   Wt 295 lb 6.4 oz (134 kg)   SpO2 100%   BMI 39.79 kg/m  {Vitals History (Optional):23777}  Physical Exam Vitals reviewed.  Constitutional:      Appearance: Normal appearance. He is well-groomed. He is obese.  HENT:     Head: Normocephalic and atraumatic.  Eyes:     Extraocular Movements: Extraocular movements intact.     Conjunctiva/sclera: Conjunctivae normal.     Pupils: Pupils are equal, round, and reactive to light.  Cardiovascular:     Rate and Rhythm: Normal rate and regular rhythm.     Pulses: Normal pulses.     Heart sounds: S1 normal and S2 normal. No murmur heard. Pulmonary:     Effort: Pulmonary effort is normal.     Breath sounds: Normal breath sounds and air entry. No rales.  Abdominal:     General: Abdomen is flat. Bowel sounds are normal.     Palpations: Abdomen is soft.     Tenderness: There is no abdominal tenderness.  Musculoskeletal:     Right lower leg: No edema.     Left lower leg: No edema.  Neurological:     General: No focal deficit present.     Mental Status: He is alert and oriented to person, place, and time.     Gait: Gait is intact.     Deep Tendon Reflexes: Reflexes are normal and symmetric.  Psychiatric:        Mood and Affect: Mood and affect normal.      Results for orders placed or performed in visit on 10/10/21  POC HgB A1c  Result Value Ref  Range   Hemoglobin A1C 5.6 4.0 - 5.6 %   HbA1c POC (<> result, manual entry)     HbA1c, POC (prediabetic range)     HbA1c, POC (controlled diabetic range)      {Labs (Optional):23779}  The ASCVD Risk score (Arnett DK, et al., 2019) failed to calculate for the following reasons:   The 2019 ASCVD risk score is only valid for ages 76 to 9    Assessment & Plan:   Problem List Items Addressed This Visit       Other   Hyperglycemia - Primary   Relevant Orders   POC HgB A1c (Completed)    No follow-ups on file.    Karie Georges, MD

## 2021-10-10 NOTE — Patient Instructions (Signed)
Call your insurance company to see if they cover Wegovy or Saxenda for weight loss therapy.

## 2021-10-11 NOTE — Progress Notes (Signed)
Established Patient Office Visit  Subjective   Patient ID: Dale Harrell, male    DOB: 1990/12/19  Age: 31 y.o. MRN: [id number]  Chief Complaint  Patient presents with   Establish Care   Form Completion    Patient brought in a form be completed for his job    Patient is here for his annual physical. Patient reports no new symptoms or concerns today. States that he was told by the [employer] physician that he needed screening for OSA. He reports some snoring at night, but he wakes feeling rested, denies any morning headaches. He does report some mild daytime somnolence however. Neck circumference is >17 inches.  Hyperglycemia-- he was told he was a prediabetic by a previous physician, states he has been controlling it with diet and exercise. A1C performed in office today and was 5.6. We discussed low carb foods and I gave him handouts. He reports he does want to try and lose weight to make this problem better.   Patient Active Problem List   Diagnosis Date Noted   Seasonal and perennial allergic rhinitis 05/15/2018   Anaphylactic shock due to adverse food reaction 05/15/2018   Hypogonadism in male 04/01/2017   Hyperglycemia 04/01/2017   Anxiety 04/01/2017   Morbid obesity (HCC) 07/07/2015   Mastalgia 06/02/2015   Gynecomastia 06/02/2015       Review of Systems  HENT:  Negative for hearing loss.   Eyes:  Negative for blurred vision.  Respiratory:  Negative for shortness of breath.   Cardiovascular:  Negative for chest pain and leg swelling.  Musculoskeletal:  Negative for back pain.  All other systems reviewed and are negative.      Objective:     BP 122/76 (BP Location: Left Arm, Patient Position: Sitting, Cuff Size: Large)   Pulse 65   Temp 98 F (36.7 C) (Oral)   Ht 6' 0.25" (1.835 m)   Wt 295 lb 6.4 oz (134 kg)   SpO2 100%   BMI 39.79 kg/m  BP Readings from Last 3 Encounters:  10/10/21 122/76  09/07/20 102/80  06/08/20 116/70   Wt Readings from Last 3  Encounters:  10/10/21 295 lb 6.4 oz (134 kg)  09/07/20 290 lb 4.8 oz (131.7 kg)  06/08/20 298 lb 14.4 oz (135.6 kg)      Physical Exam Vitals reviewed.  Constitutional:      Appearance: Normal appearance. He is well-groomed. He is obese.  HENT:     Head: Normocephalic and atraumatic.  Eyes:     Extraocular Movements: Extraocular movements intact.     Conjunctiva/sclera: Conjunctivae normal.     Pupils: Pupils are equal, round, and reactive to light.  Cardiovascular:     Rate and Rhythm: Normal rate and regular rhythm.     Pulses: Normal pulses.     Heart sounds: S1 normal and S2 normal. No murmur heard. Pulmonary:     Effort: Pulmonary effort is normal.     Breath sounds: Normal breath sounds and air entry. No rales.  Abdominal:     General: Abdomen is flat. Bowel sounds are normal.     Palpations: Abdomen is soft.     Tenderness: There is no abdominal tenderness.  Musculoskeletal:     Right lower leg: No edema.     Left lower leg: No edema.  Neurological:     General: No focal deficit present.     Mental Status: He is alert and oriented to person, place, and time.  Gait: Gait is intact.     Deep Tendon Reflexes: Reflexes are normal and symmetric.  Psychiatric:        Mood and Affect: Mood and affect normal.      Results for orders placed or performed in visit on 10/10/21  POC HgB A1c  Result Value Ref Range   Hemoglobin A1C 5.6 4.0 - 5.6 %   HbA1c POC (<> result, manual entry)     HbA1c, POC (prediabetic range)     HbA1c, POC (controlled diabetic range)      Last lipids Lab Results  Component Value Date   CHOL 131 06/08/2020   HDL 47.70 06/08/2020   LDLCALC 65 06/08/2020   TRIG 92.0 06/08/2020   CHOLHDL 3 06/08/2020   Last hemoglobin A1c Lab Results  Component Value Date   HGBA1C 5.6 10/10/2021      The ASCVD Risk score (Arnett DK, et al., 2019) failed to calculate for the following reasons:   The 2019 ASCVD risk score is only valid for ages 37  to 47    Assessment & Plan:   Problem List Items Addressed This Visit       Other   Hyperglycemia - Primary    Prediabetes in the past, today his A1C is 5.6 which is nearly normal. We discussed anticipatory guidance today including healthy eating and exercise. I gave him handouts on low carb foods. I advised him to call his insurance company to determine if they cover weight loss medications. If so he might consider trying Wegovy or Saxenda to help with weight loss.      Relevant Orders   POC HgB A1c (Completed)   Other Visit Diagnoses     Daytime somnolence       Relevant Orders   Ambulatory referral to Pulmonology  Neck circumference >17 inches with some reports of daytime somnolence. I will send him to Pulmonary for a sleep study evaluation.     Return in about 1 year (around 10/11/2022) for annual physical exam.    Karie Georges, MD

## 2021-10-11 NOTE — Assessment & Plan Note (Signed)
Prediabetes in the past, today his A1C is 5.6 which is nearly normal. We discussed anticipatory guidance today including healthy eating and exercise. I gave him handouts on low carb foods. I advised him to call his insurance company to determine if they cover weight loss medications. If so he might consider trying Wegovy or Saxenda to help with weight loss.

## 2022-09-27 ENCOUNTER — Encounter (INDEPENDENT_AMBULATORY_CARE_PROVIDER_SITE_OTHER): Payer: Self-pay

## 2022-10-12 ENCOUNTER — Encounter: Payer: PRIVATE HEALTH INSURANCE | Admitting: Family Medicine

## 2022-10-18 ENCOUNTER — Encounter: Payer: Self-pay | Admitting: Family Medicine

## 2023-01-07 ENCOUNTER — Encounter: Payer: Self-pay | Admitting: Family Medicine

## 2023-03-17 IMAGING — DX DG KNEE COMPLETE 4+V*R*
4 series · 4 of 4 positions shown · non-contrast
Comparison: None.

CLINICAL DATA: Knee pain.  No known injuries.

EXAM:
RIGHT KNEE - COMPLETE 4+ VIEW

[knee ap]
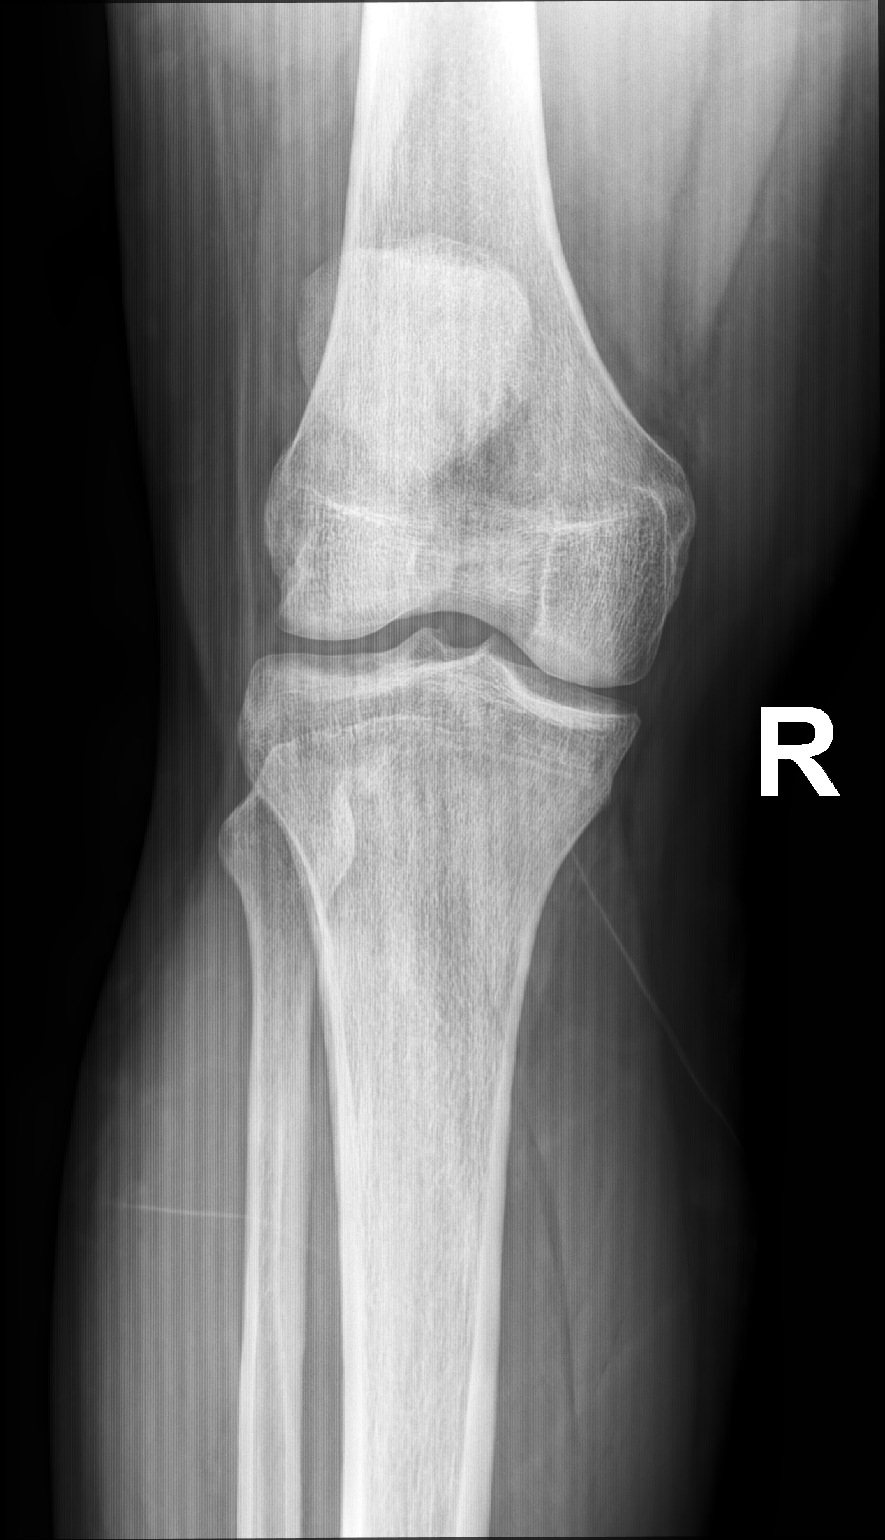

[knee [person_name] view pa]
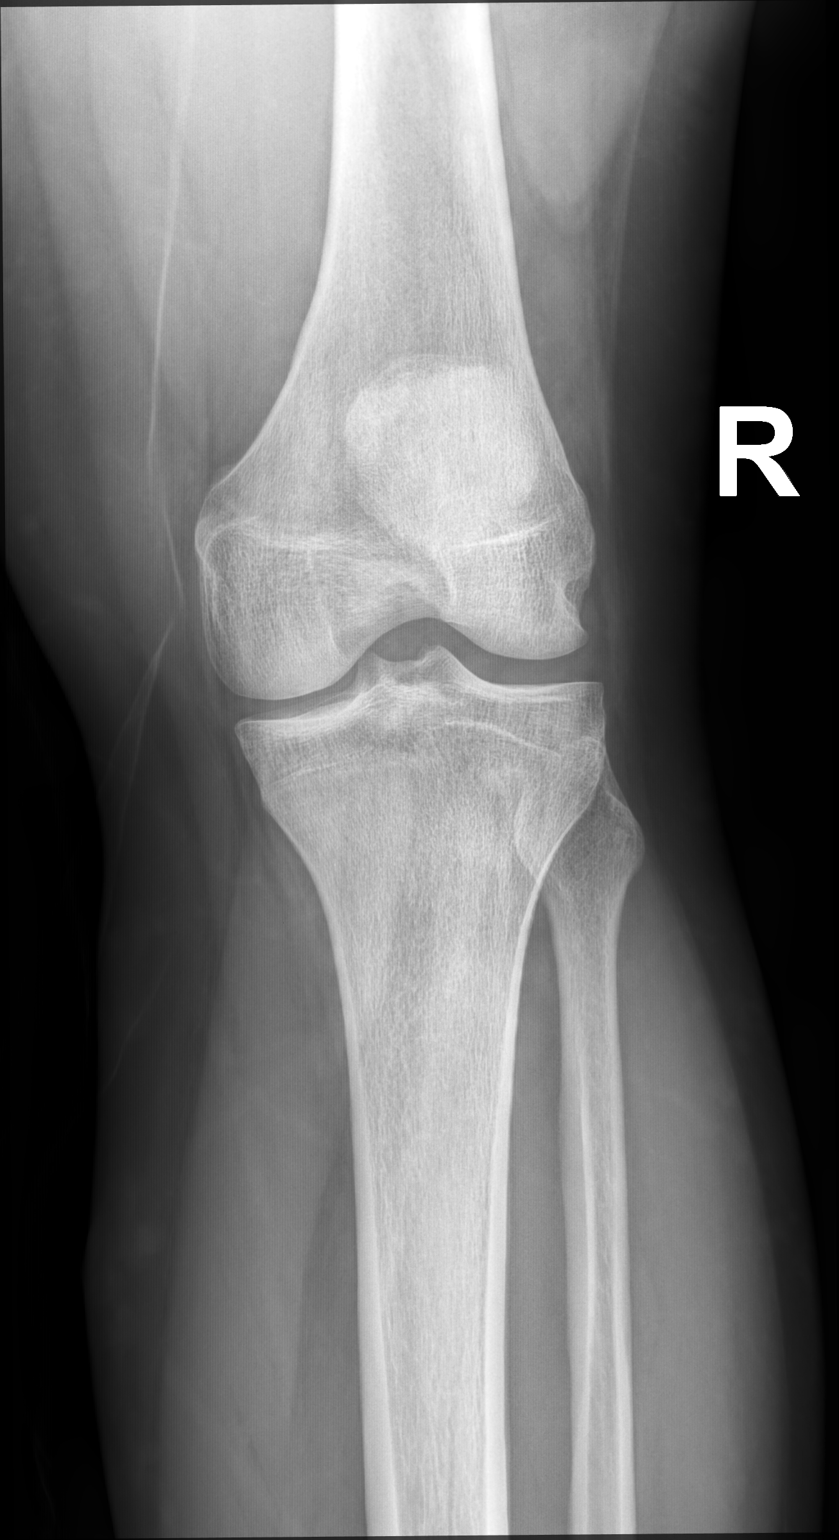

[patella (sunrise) tan]
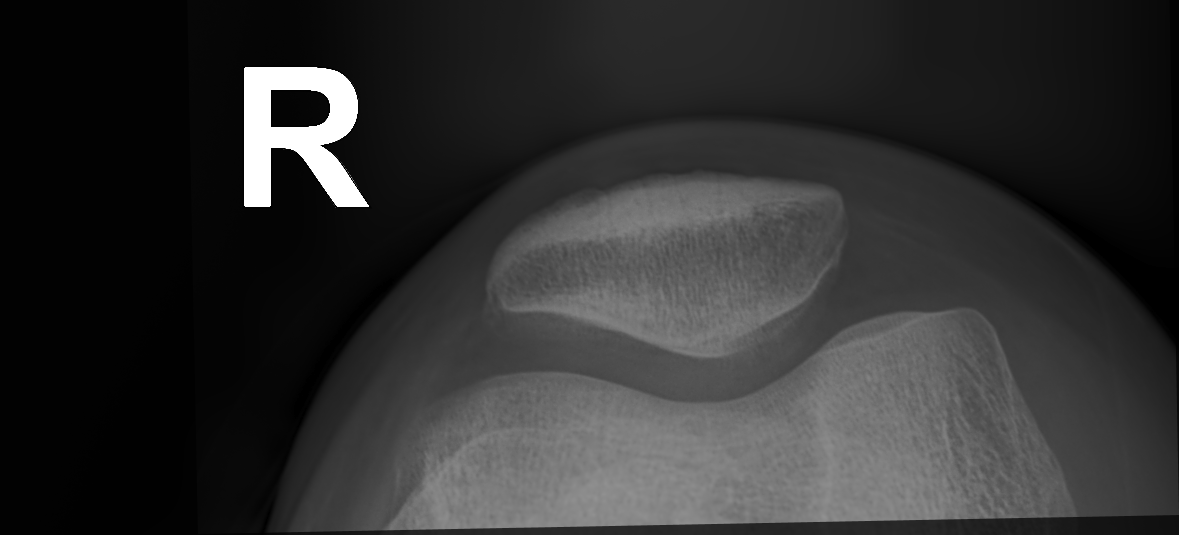

[knee lat]
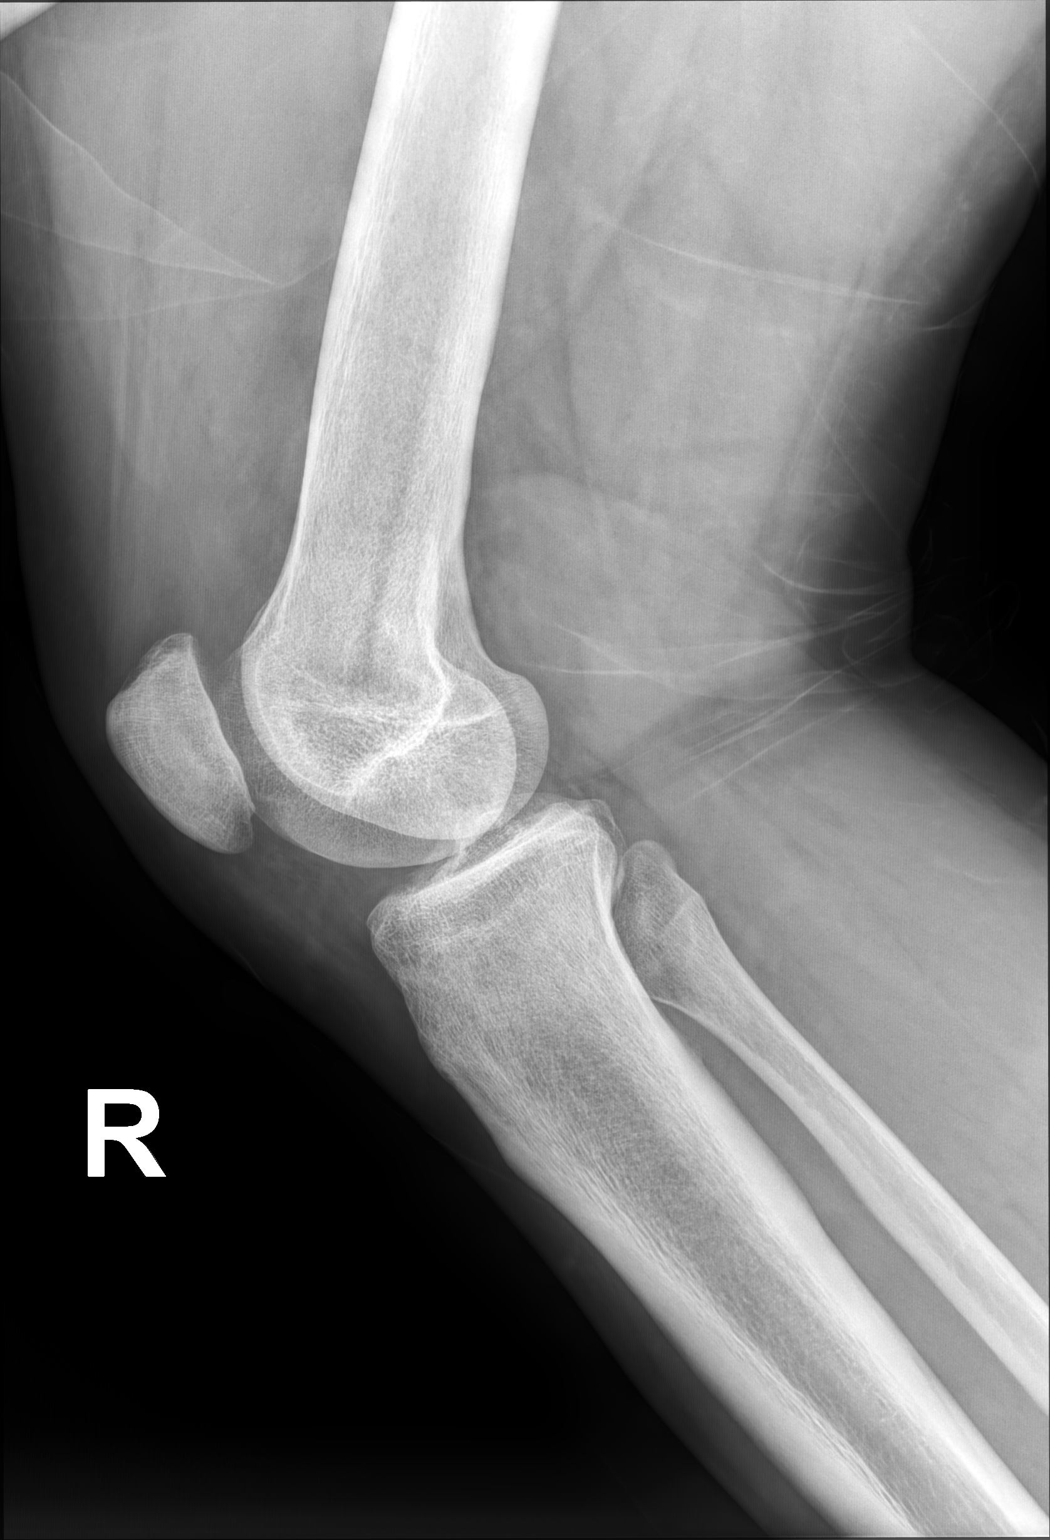

[4 of 4 positions shown; findings below may reference images not displayed]

FINDINGS: No evidence of acute fracture or dislocation. Small joint effusion.
Mild medial compartment joint space loss. Unremarkable soft tissues.
IMPRESSION: 1. No evidence of acute fracture or dislocation.
2. Small joint effusion.
3. Mild medial compartment joint space loss.

## 2023-03-25 ENCOUNTER — Ambulatory Visit (INDEPENDENT_AMBULATORY_CARE_PROVIDER_SITE_OTHER): Payer: Self-pay | Admitting: Family Medicine

## 2023-03-25 VITALS — BP 116/80 | HR 80 | Temp 98.4°F | Ht 71.25 in | Wt 320.3 lb

## 2023-03-25 DIAGNOSIS — Z0000 Encounter for general adult medical examination without abnormal findings: Secondary | ICD-10-CM | POA: Diagnosis not present

## 2023-03-25 DIAGNOSIS — Z91018 Allergy to other foods: Secondary | ICD-10-CM | POA: Diagnosis not present

## 2023-03-25 DIAGNOSIS — E291 Testicular hypofunction: Secondary | ICD-10-CM

## 2023-03-25 DIAGNOSIS — R739 Hyperglycemia, unspecified: Secondary | ICD-10-CM | POA: Diagnosis not present

## 2023-03-25 DIAGNOSIS — Z13220 Encounter for screening for lipoid disorders: Secondary | ICD-10-CM

## 2023-03-25 LAB — COMPREHENSIVE METABOLIC PANEL
ALT: 31 U/L (ref 0–53)
AST: 19 U/L (ref 0–37)
Albumin: 4.3 g/dL (ref 3.5–5.2)
Alkaline Phosphatase: 63 U/L (ref 39–117)
BUN: 10 mg/dL (ref 6–23)
CO2: 27 meq/L (ref 19–32)
Calcium: 9.6 mg/dL (ref 8.4–10.5)
Chloride: 102 meq/L (ref 96–112)
Creatinine, Ser: 0.98 mg/dL (ref 0.40–1.50)
GFR: 101.7 mL/min (ref >60.00)
Glucose, Bld: 91 mg/dL (ref 70–99)
Potassium: 4.2 meq/L (ref 3.5–5.1)
Sodium: 139 meq/L (ref 135–145)
Total Bilirubin: 0.6 mg/dL (ref 0.2–1.2)
Total Protein: 7.8 g/dL (ref 6.0–8.3)

## 2023-03-25 LAB — LIPID PANEL
Cholesterol: 132 mg/dL (ref 0–200)
HDL: 37.1 mg/dL — ABNORMAL LOW (ref >39.00)
LDL Cholesterol: 76 mg/dL (ref 0–99)
NonHDL: 94.93
Total CHOL/HDL Ratio: 4
Triglycerides: 94 mg/dL (ref 0.0–149.0)
VLDL: 18.8 mg/dL (ref 0.0–40.0)

## 2023-03-25 MED ORDER — EPINEPHRINE 0.3 MG/0.3ML IJ SOAJ: 0.3000 mg | INTRAMUSCULAR | 2 refills | Status: AC | PRN

## 2023-03-25 NOTE — Patient Instructions (Signed)
 Health Maintenance, Male  Adopting a healthy lifestyle and getting preventive care are important in promoting health and wellness. Ask your health care provider about:  The right schedule for you to have regular tests and exams.  Things you can do on your own to prevent diseases and keep yourself healthy.  What should I know about diet, weight, and exercise?  Eat a healthy diet    Eat a diet that includes plenty of vegetables, fruits, low-fat dairy products, and lean protein.  Do not eat a lot of foods that are high in solid fats, added sugars, or sodium.  Maintain a healthy weight  Body mass index (BMI) is a measurement that can be used to identify possible weight problems. It estimates body fat based on height and weight. Your health care provider can help determine your BMI and help you achieve or maintain a healthy weight.  Get regular exercise  Get regular exercise. This is one of the most important things you can do for your health. Most adults should:  Exercise for at least 150 minutes each week. The exercise should increase your heart rate and make you sweat (moderate-intensity exercise).  Do strengthening exercises at least twice a week. This is in addition to the moderate-intensity exercise.  Spend less time sitting. Even light physical activity can be beneficial.  Watch cholesterol and blood lipids  Have your blood tested for lipids and cholesterol at 33 years of age, then have this test every 5 years.  You may need to have your cholesterol levels checked more often if:  Your lipid or cholesterol levels are high.  You are older than 33 years of age.  You are at high risk for heart disease.  What should I know about cancer screening?  Many types of cancers can be detected early and may often be prevented. Depending on your health history and family history, you may need to have cancer screening at various ages. This may include screening for:  Colorectal cancer.  Prostate cancer.  Skin cancer.  Lung  cancer.  What should I know about heart disease, diabetes, and high blood pressure?  Blood pressure and heart disease  High blood pressure causes heart disease and increases the risk of stroke. This is more likely to develop in people who have high blood pressure readings or are overweight.  Talk with your health care provider about your target blood pressure readings.  Have your blood pressure checked:  Every 3-5 years if you are 9-95 years of age.  Every year if you are 85 years old or older.  If you are between the ages of 29 and 29 and are a current or former smoker, ask your health care provider if you should have a one-time screening for abdominal aortic aneurysm (AAA).  Diabetes  Have regular diabetes screenings. This checks your fasting blood sugar level. Have the screening done:  Once every three years after age 23 if you are at a normal weight and have a low risk for diabetes.  More often and at a younger age if you are overweight or have a high risk for diabetes.  What should I know about preventing infection?  Hepatitis B  If you have a higher risk for hepatitis B, you should be screened for this virus. Talk with your health care provider to find out if you are at risk for hepatitis B infection.  Hepatitis C  Blood testing is recommended for:  Everyone born from [birth year] through 1965.  Anyone  with known risk factors for hepatitis C.  Sexually transmitted infections (STIs)  You should be screened each year for STIs, including gonorrhea and chlamydia, if:  You are sexually active and are younger than 33 years of age.  You are older than 33 years of age and your health care provider tells you that you are at risk for this type of infection.  Your sexual activity has changed since you were last screened, and you are at increased risk for chlamydia or gonorrhea. Ask your health care provider if you are at risk.  Ask your health care provider about whether you are at high risk for HIV. Your health care provider  may recommend a prescription medicine to help prevent HIV infection. If you choose to take medicine to prevent HIV, you should first get tested for HIV. You should then be tested every 3 months for as long as you are taking the medicine.  Follow these instructions at home:  Alcohol use  Do not drink alcohol if your health care provider tells you not to drink.  If you drink alcohol:  Limit how much you have to 0-2 drinks a day.  Know how much alcohol is in your drink. In the [place] one drink equals one 12 oz bottle of beer (355 mL), one 5 oz glass of wine (148 mL), or one 1 oz glass of hard liquor (44 mL).  Lifestyle  Do not use any products that contain nicotine or tobacco. These products include cigarettes, chewing tobacco, and vaping devices, such as e-cigarettes. If you need help quitting, ask your health care provider.  Do not use street drugs.  Do not share needles.  Ask your health care provider for help if you need support or information about quitting drugs.  General instructions  Schedule regular health, dental, and eye exams.  Stay current with your vaccines.  Tell your health care provider if:  You often feel depressed.  You have ever been abused or do not feel safe at home.  Summary  Adopting a healthy lifestyle and getting preventive care are important in promoting health and wellness.  Follow your health care provider's instructions about healthy diet, exercising, and getting tested or screened for diseases.  Follow your health care provider's instructions on monitoring your cholesterol and blood pressure.  This information is not intended to replace advice given to you by your health care provider. Make sure you discuss any questions you have with your health care provider.  Document Revised: 06/20/2020 Document Reviewed: 06/20/2020  Elsevier Patient Education  2024 [employer].

## 2023-03-25 NOTE — Progress Notes (Signed)
 Complete physical exam  Patient: Dale Harrell   DOB: [birth year]   32 y.o. Male  MRN: [id number]  Subjective:    Chief Complaint  Patient presents with   Annual Exam    Dale Harrell is a 33 y.o. male who presents today for a complete physical exam. He reports consuming a general diet. Gym/ health club routine includes lifting, cardio, going about 2-3 times per week. He generally feels well. He reports sleeping well. He does not have additional problems to discuss today.    Most recent fall risk assessment:    04/01/2017    5:06 PM  Fall Risk   Falls in the past year? No     Most recent depression screenings:    03/25/2023   10:07 AM 10/10/2021    2:54 PM  PHQ 2/9 Scores  PHQ - 2 Score 0 0  PHQ- 9 Score 1 2    Vision:has appt scheduled with the eye doctor, no vision problems and Dental: No current dental problems and Receives regular dental care  Patient Active Problem List   Diagnosis Date Noted   Seasonal and perennial allergic rhinitis 05/15/2018   Anaphylactic shock due to adverse food reaction 05/15/2018   Hypogonadism in male 04/01/2017   Hyperglycemia 04/01/2017   Anxiety 04/01/2017   Morbid obesity (HCC) 07/07/2015   Mastalgia 06/02/2015   Gynecomastia 06/02/2015      Patient Care Team: Aida House, MD as PCP - General (Family Medicine)   Outpatient Medications Prior to Visit  Medication Sig   MAGNESIUM PO Take by mouth daily. As needed for cramps   Multiple Vitamin (MULTIVITAMIN) tablet Take 1 tablet by mouth daily.   [DISCONTINUED] EPINEPHrine  (AUVI-Q ) 0.3 mg/0.3 mL IJ SOAJ injection Inject 0.3 mg into the muscle as needed for anaphylaxis.   [DISCONTINUED] clomiPHENE Citrate (CLOMID PO) Take by mouth daily.   [DISCONTINUED] Omega-3 Fatty Acids (OMEGA 3 PO) Take by mouth daily.   [DISCONTINUED] OVER THE COUNTER MEDICATION Sea moss   No facility-administered medications prior to visit.    Review of Systems  HENT:  Negative for  hearing loss.   Eyes:  Negative for blurred vision.  Respiratory:  Negative for shortness of breath.   Cardiovascular:  Negative for chest pain.  Gastrointestinal: Negative.   Genitourinary: Negative.   Musculoskeletal:  Negative for back pain.  Neurological:  Negative for headaches.  Psychiatric/Behavioral:  Negative for depression.   All other systems reviewed and are negative.      Objective:     BP 116/80   Pulse 80   Temp 98.4 F (36.9 C) (Oral)   Ht 5' 11.25" (1.81 m)   Wt (!) 320 lb 4.8 oz (145.3 kg)   SpO2 98%   BMI 44.36 kg/m    Physical Exam Vitals reviewed.  Constitutional:      Appearance: Normal appearance. He is well-groomed. He is morbidly obese.  HENT:     Right Ear: Tympanic membrane and ear canal normal.     Left Ear: Tympanic membrane and ear canal normal.     Mouth/Throat:     Mouth: Mucous membranes are moist.     Pharynx: No posterior oropharyngeal erythema.  Eyes:     Extraocular Movements: Extraocular movements intact.     Conjunctiva/sclera: Conjunctivae normal.  Neck:     Thyroid : No thyromegaly.  Cardiovascular:     Rate and Rhythm: Normal rate and regular rhythm.     Heart sounds: S1 normal and S2  normal. No murmur heard. Pulmonary:     Effort: Pulmonary effort is normal.     Breath sounds: Normal breath sounds and air entry. No rales.  Abdominal:     General: Abdomen is flat. Bowel sounds are normal.  Musculoskeletal:     Right lower leg: No edema.     Left lower leg: No edema.  Lymphadenopathy:     Cervical: No cervical adenopathy.  Neurological:     General: No focal deficit present.     Mental Status: He is alert and oriented to person, place, and time.     Gait: Gait is intact.  Psychiatric:        Mood and Affect: Mood and affect normal.      No results found for any visits on 03/25/23.     Assessment & Plan:    Routine Health Maintenance and Physical Exam  Immunization History  Administered Date(s) Administered    Tdap 06/09/2015    Health Maintenance  Topic Date Due   COVID-19 Vaccine (1 - 2024-25 season) Never done   INFLUENZA VACCINE  05/13/2023 (Originally 09/13/2022)   Hepatitis C Screening  03/24/2024 (Originally 04/02/2008)   DTaP/Tdap/Td (2 - Td or Tdap) 06/08/2025   HIV Screening  Completed   HPV VACCINES  Aged Out    Discussed health benefits of physical activity, and encouraged him to engage in regular exercise appropriate for his age and condition.  Hyperglycemia -     Hemoglobin A1c; Future  Multiple food allergies -     EPINEPHrine ; Inject 0.3 mg into the muscle as needed for anaphylaxis.  Dispense: 4 each; Refill: 2  Routine adult health maintenance  Lipid screening -     Lipid panel; Future  Routine general medical examination at a health care facility -     CBC with Differential/Platelet; Future -     Comprehensive metabolic panel; Future  Hypogonadism in male -     Testosterone ; Future  Normal physical exam findings today, counseled patient on healthy sleep habits/ hygiene, handouts given on healthy eating and exercise. I also counseled him on healthy bowel habits, he did report a bit of indigestion on ROS so I counseled him about OTC medications for heartburn, checking labs for annual surveillance.   Return in about 1 year (around 03/24/2024) for annual physical exam.     Aida House, MD

## 2023-03-27 ENCOUNTER — Encounter: Payer: Self-pay | Admitting: Family Medicine

## 2023-12-14 ENCOUNTER — Other Ambulatory Visit: Payer: Self-pay

## 2023-12-14 ENCOUNTER — Ambulatory Visit: Admission: RE | Admit: 2023-12-14 | Discharge: 2023-12-14 | Disposition: A | Discharge status: Home / Self Care

## 2023-12-14 VITALS — BP 168/96 | HR 92 | Temp 98.5°F | Resp 18 | Ht 71.25 in | Wt 320.3 lb

## 2023-12-14 DIAGNOSIS — L239 Allergic contact dermatitis, unspecified cause: Secondary | ICD-10-CM

## 2023-12-14 DIAGNOSIS — L509 Urticaria, unspecified: Secondary | ICD-10-CM | POA: Diagnosis not present

## 2023-12-14 MED ORDER — DEXAMETHASONE SOD PHOSPHATE PF 10 MG/ML IJ SOLN
10.0000 mg | Freq: Once | INTRAMUSCULAR | Status: AC
  Administered 2023-12-14: 10 mg via INTRAMUSCULAR

## 2023-12-14 MED ORDER — CETIRIZINE HCL 10 MG PO TABS
10.0000 mg | ORAL_TABLET | Freq: Every day | ORAL | Status: DC
  Administered 2023-12-14: 10 mg via ORAL

## 2023-12-14 NOTE — ED Triage Notes (Addendum)
 Pt presents with a chief complaint of rash/hives covering generalized body x 3 days. States his symptoms began after his daughter vomited on him. Has been itching. No pain. Very uncomfortable. OTC Benadryl taken for symptoms with some improvement. Telehealth visit yesterday, prescribed prednisone. Has only taken one dose. This was last night at 9 PM. Has not helped.

## 2023-12-14 NOTE — ED Notes (Addendum)
 Erin, PA pulled to an emergency. Pt was made aware and understanding of the delay in care.

## 2023-12-14 NOTE — Discharge Instructions (Addendum)
 VISIT SUMMARY:  You came in today because of a rash and itching that started after your daughter, who was sick, vomited on you. The rash and itching began a few hours after the incident and have been persistent despite taking Benadryl and prednisone. You have a known allergy  to shellfish, and your daughter had consumed shrimp a few days before the symptoms started.  YOUR PLAN:  -ACUTE URTICARIA (HIVES) WITH PRURITUS: Acute urticaria, also known as hives, is a skin reaction that causes red, itchy welts. It is likely that your hives were triggered by contact with vomit containing shellfish. You received a Decadron injection today to help relieve the itching and allergic reaction. Continue taking prednisone as prescribed, but take it in the morning with breakfast to avoid sleeplessness. Switch from Benadryl to Zyrtec starting tomorrow, taking it once daily for longer-lasting relief. Oatmeal baths are also recommended to help soothe your skin. A work note has been provided for your absence due to these symptoms.  -SHELLFISH ALLERGY : A shellfish allergy  is an immune system reaction that occurs soon after eating shellfish. In your case, it is possible that your daughter's vomit contained shellfish, which triggered your allergic reaction. No severe allergic reactions like difficulty breathing or tongue swelling were observed, but the hives suggest an allergic response.  INSTRUCTIONS:  Please follow up if your symptoms do not improve or if they worsen. Continue taking your medications as directed and switch to Zyrtec for better relief. If you experience any severe symptoms such as difficulty breathing, seek medical attention immediately.

## 2023-12-14 NOTE — ED Provider Notes (Signed)
 [facility]    CSN: [id number] Arrival date & time: 12/14/23  1139      History   Chief Complaint Chief Complaint  Patient presents with   Rash    It's like I keep breaking out in hives that itches really bad. I have took Benadryl it goes away and comes back. - Entered by patient   Urticaria    HPI ZEVIN NEVARES is a 33 y.o. male.  has a past medical history of Anxiety, Chicken pox, Hyperlipidemia, and Migraine.   HPI  Discussed the use of AI scribe software for clinical note transcription with the patient, who gave verbal consent to proceed.  The patient presents with a rash and itching.  The patient experienced a rash and itching that began around 6:00 AM on a Thursday after their daughter, who was sick, vomited on them. Initially, there was redness where the vomit contacted their skin, which they washed off. Two hours later, they developed widespread itching and hives.  They took two Benadryl tablets at 12:00 PM, which temporarily alleviated the symptoms. However, the symptoms returned later, prompting them to contact [facility] at 7:00 PM. They were prescribed prednisone, which they began taking at 9:00 PM. Despite this, the symptoms persisted, causing significant discomfort throughout the night.  They took Benadryl again at 7:00 AM the following morning, providing relief for only three hours. They have been prescribed a five-day course of prednisone, with eight pills remaining. No shortness of breath, wheezing, chest tightness, tongue swelling, nausea, vomiting, diarrhea, or palpitations.  The rash and itching are present on their chest and back. They find relief from itching by taking hot, steamy showers, which temporarily calm the symptoms. They have not used any new soaps, detergents, fragrances, lotions, or medications other than prednisone.  They have a known allergy  to shellfish and recall their daughter consuming shrimp on the Sunday prior to the  onset of symptoms.   Past Medical History:  Diagnosis Date   Anxiety    Chicken pox    Hyperlipidemia    Migraine    Not since 33 years of age.    Patient Active Problem List   Diagnosis Date Noted   Seasonal and perennial allergic rhinitis 05/15/2018   Anaphylactic shock due to adverse food reaction 05/15/2018   Hypogonadism in male 04/01/2017   Hyperglycemia 04/01/2017   Anxiety 04/01/2017   Morbid obesity (HCC) 07/07/2015   Mastalgia 06/02/2015   Gynecomastia 06/02/2015    Past Surgical History:  Procedure Laterality Date   MULTIPLE TOOTH EXTRACTIONS         Home Medications    Prior to Admission medications   Medication Sig Start Date End Date Taking? Authorizing Provider  predniSONE (DELTASONE) 20 MG tablet Take 20 mg by mouth 2 (two) times daily. 12/13/23  Yes [provider]  EPINEPHrine  (AUVI-Q ) 0.3 mg/0.3 mL IJ SOAJ injection Inject 0.3 mg into the muscle as needed for anaphylaxis. 03/25/23   Ozell Heron CHRISTELLA, MD  MAGNESIUM PO Take by mouth daily. As needed for cramps    [provider]  Multiple Vitamin (MULTIVITAMIN) tablet Take 1 tablet by mouth daily.    [provider]    Family History Family History  Problem Relation Age of Onset   Kassie Pour White syndrome Brother    Healthy Mother    Healthy Father    Cancer Maternal Grandmother        patient uncertain type   Diabetes Other  mother side of family   Diabetes Sister    Other Neg Hx        gynecomastia    Social History Social History   Tobacco Use   Smoking status: Former    Current packs/day: 0.00    Types: Cigars, Cigarettes    Quit date: 09/01/2016    Years since quitting: 7.2   Smokeless tobacco: Never  Vaping Use   Vaping status: Never Used  Substance Use Topics   Alcohol use: Not Currently    Alcohol/week: 0.0 standard drinks of alcohol   Drug use: No     Allergies   Shellfish allergy    Review of Systems Review of Systems   Constitutional:  Negative for chills and fever.  Respiratory:  Negative for cough, chest tightness, shortness of breath and wheezing.   Gastrointestinal:  Negative for abdominal pain, diarrhea, nausea and vomiting.  Skin:  Positive for color change and rash.     Physical Exam Triage Vital Signs ED Triage Vitals  Encounter Vitals Group     BP 12/14/23 1208 (!) 161/113     Girls Systolic BP Percentile --      Girls Diastolic BP Percentile --      Boys Systolic BP Percentile --      Boys Diastolic BP Percentile --      Pulse Rate 12/14/23 1208 (!) 107     Resp 12/14/23 1208 19     Temp 12/14/23 1208 98.9 F (37.2 C)     Temp Source 12/14/23 1208 Oral     SpO2 12/14/23 1208 97 %     Weight 12/14/23 1206 (!) 320 lb 5.3 oz (145.3 kg)     Height 12/14/23 1206 5' 11.25 (1.81 m)     Head Circumference --      Peak Flow --      Pain Score 12/14/23 1206 0     Pain Loc --      Pain Education --      Exclude from Growth Chart --    No data found.  Updated Vital Signs BP (!) 168/96 (BP Location: Left Arm)   Pulse 92   Temp 98.5 F (36.9 C) (Oral)   Resp 18   Ht 5' 11.25 (1.81 m)   Wt (!) 320 lb 5.3 oz (145.3 kg)   SpO2 98%   BMI 44.36 kg/m   Visual Acuity Right Eye Distance:   Left Eye Distance:   Bilateral Distance:    Right Eye Near:   Left Eye Near:    Bilateral Near:     Physical Exam Vitals reviewed.  Constitutional:      General: He is awake.     Appearance: Normal appearance. He is well-developed and well-groomed.  HENT:     Head: Normocephalic and atraumatic.  Eyes:     Extraocular Movements: Extraocular movements intact.     Conjunctiva/sclera: Conjunctivae normal.  Cardiovascular:     Rate and Rhythm: Normal rate and regular rhythm.     Heart sounds: Normal heart sounds. No murmur heard.    No friction rub. No gallop.  Pulmonary:     Effort: Pulmonary effort is normal.     Breath sounds: Normal breath sounds. No decreased air movement. No  decreased breath sounds, wheezing, rhonchi or rales.  Musculoskeletal:     Cervical back: Normal range of motion.  Skin:    General: Skin is warm and dry.     Findings: Rash present. Rash is macular, papular and  urticarial.  Neurological:     Mental Status: He is alert and oriented to person, place, and time.  Psychiatric:        Attention and Perception: Attention normal.        Mood and Affect: Mood normal.        Speech: Speech normal.        Behavior: Behavior normal. Behavior is cooperative.      UC Treatments / Results  Labs (all labs ordered are listed, but only abnormal results are displayed) Labs Reviewed - No data to display  EKG   Radiology No results found.  Procedures Procedures (including critical care time)  Medications Ordered in UC Medications  cetirizine (ZYRTEC) tablet 10 mg (10 mg Oral Given 12/14/23 1353)  dexamethasone (DECADRON) injection 10 mg (10 mg Intramuscular Given 12/14/23 1354)    Initial Impression / Assessment and Plan / UC Course  I have reviewed the triage vital signs and the nursing notes.  Pertinent labs & imaging results that were available during my care of the patient were reviewed by me and considered in my medical decision making (see chart for details).      Final Clinical Impressions(s) / UC Diagnoses   Final diagnoses:  Full body hives  Allergic dermatitis   Acute urticaria (hives) with pruritus Acute urticaria with pruritus since Thursday morning, likely triggered by contact with vomit containing shellfish. Symptoms include widespread itching and hives, with temporary relief from Benadryl and prednisone. No anaphylactic symptoms such as shortness of breath, wheezing, or tongue swelling. Differential includes allergic reaction to shellfish, possibly from daughter's vomit. No new soaps, detergents, or medications reported. Prednisone course prescribed by [facility], but symptoms persist. Zyrtec recommended as a  non-sedating alternative to Benadryl for longer-lasting relief. - Administered Decadron injection for acute relief of itching and allergic reaction. - Continue prednisone as prescribed, but take in the morning with breakfast to avoid sleeplessness. - Switch from Benadryl to Zyrtec for anti-itch relief, starting tomorrow once daily. - Recommended oatmeal baths for symptomatic relief. - Provided work note for absence due to symptoms.  Shellfish allergy  Known shellfish allergy , with recent exposure possibly through daughter's vomit. No anaphylactic reaction observed, but acute urticaria suggests allergic response.    Discharge Instructions      VISIT SUMMARY:  You came in today because of a rash and itching that started after your daughter, who was sick, vomited on you. The rash and itching began a few hours after the incident and have been persistent despite taking Benadryl and prednisone. You have a known allergy  to shellfish, and your daughter had consumed shrimp a few days before the symptoms started.  YOUR PLAN:  -ACUTE URTICARIA (HIVES) WITH PRURITUS: Acute urticaria, also known as hives, is a skin reaction that causes red, itchy welts. It is likely that your hives were triggered by contact with vomit containing shellfish. You received a Decadron injection today to help relieve the itching and allergic reaction. Continue taking prednisone as prescribed, but take it in the morning with breakfast to avoid sleeplessness. Switch from Benadryl to Zyrtec starting tomorrow, taking it once daily for longer-lasting relief. Oatmeal baths are also recommended to help soothe your skin. A work note has been provided for your absence due to these symptoms.  -SHELLFISH ALLERGY : A shellfish allergy  is an immune system reaction that occurs soon after eating shellfish. In your case, it is possible that your daughter's vomit contained shellfish, which triggered your allergic reaction. No severe allergic  reactions  like difficulty breathing or tongue swelling were observed, but the hives suggest an allergic response.  INSTRUCTIONS:  Please follow up if your symptoms do not improve or if they worsen. Continue taking your medications as directed and switch to Zyrtec for better relief. If you experience any severe symptoms such as difficulty breathing, seek medical attention immediately.     ED Prescriptions   None    PDMP not reviewed this encounter.   Marylene Rocky BRAVO, PA-C 12/14/23 [id number]

## 2023-12-16 ENCOUNTER — Ambulatory Visit: Payer: Self-pay

## 2023-12-16 NOTE — Telephone Encounter (Signed)
 FYI Only or Action Required?: FYI only for provider: appointment scheduled on tomorrow morning. Pt is still taking prednisone  and zyrtec.   Patient was last seen in primary care on 03/25/2023 by Ozell Heron HERO, MD.  Called Nurse Triage reporting Allergic Reaction. May be from a collagen supplement.  Symptoms began several days ago.  Interventions attempted: Other: Seen at [facility] and had a vv.  Symptoms are: gradually improving.  Triage Disposition: See Physician Within 24 Hours  Patient/caregiver understands and will follow disposition?: Yes                   Reason for Triage: Hives/itching, went to urgent care, given dermisol. new supplement - collagen which may contain shellfish. No longer hives but itching and red bumps    Reason for Disposition  Hives or itching  Answer Assessment - Initial Assessment Questions 1. APPEARANCE of RASH: What does the rash look like? (e.g., spots, blisters, raised areas, skin peeling, scaly)     Took zyrtec at 2pm and now mostly resolved 2. SIZE: How big are the spots? (e.g., tip of pen, eraser, coin; inches, centimeters)     Only had hives 3. LOCATION: Where is the rash located?     Started back of arms - then moved down arm. 4. COLOR: What color is the rash? (Note: It is difficult to assess rash color in people with darker-colored skin. When this situation occurs, simply ask the caller to describe what they see.)     red 5. ONSET: When did the rash begin?     Thursday 6. FEVER: Do you have a fever? If Yes, ask: What is your temperature, how was it measured, and when did it start?     No 7. ITCHING: Does the rash itch? If Yes, ask: How bad is the itch? (Scale 1-10; or mild, moderate, severe)     rash 8. CAUSE: What do you think is causing the rash?     Collagen supplement 9. NEW MEDICINES: What new medicines are you taking? (e.g., name of antibiotic) When did you start taking this medication?.      no 10. OTHER SYMPTOMS: Do you have any other symptoms? (e.g., sore throat, fever, joint pain)       Rash - itching  Protocols used: Rash - Widespread On Drugs-A-AH

## 2023-12-17 ENCOUNTER — Ambulatory Visit: Payer: Self-pay | Admitting: Family Medicine

## 2023-12-17 ENCOUNTER — Ambulatory Visit (INDEPENDENT_AMBULATORY_CARE_PROVIDER_SITE_OTHER): Admitting: Family Medicine

## 2023-12-17 VITALS — BP 130/80 | HR 75 | Temp 98.1°F | Ht 71.25 in | Wt 338.4 lb

## 2023-12-17 DIAGNOSIS — R400 Somnolence: Secondary | ICD-10-CM | POA: Diagnosis not present

## 2023-12-17 DIAGNOSIS — R739 Hyperglycemia, unspecified: Secondary | ICD-10-CM | POA: Diagnosis not present

## 2023-12-17 DIAGNOSIS — T7840XD Allergy, unspecified, subsequent encounter: Secondary | ICD-10-CM | POA: Diagnosis not present

## 2023-12-17 LAB — HEMOGLOBIN A1C: Hgb A1c MFr Bld: 6.6 % — ABNORMAL HIGH (ref 4.6–6.5)

## 2023-12-17 NOTE — Patient Instructions (Signed)
 Continue antihistamines daily for the next 5-7 days, then try stopping them to see if the rash returns.

## 2023-12-17 NOTE — Telephone Encounter (Signed)
 Noted- ok to close.

## 2023-12-17 NOTE — Progress Notes (Signed)
 Acute Office Visit  Subjective:     Patient ID: Dale Harrell, male    DOB: 1990/10/31, 33 y.o.   MRN: [id number]  Chief Complaint  Patient presents with   Urticaria    Patient complains of hives noted along the left axilla-5 days ago, used Benadryl, had an E-visit, given Prednisone, due to recurrent hives he seen at an urgent care, given a steroid injection and itching noted-questioned if symptoms were related to new Collagen supplement he tried and now discontinued    Urticaria  Discussed the use of AI scribe software for clinical note transcription with the patient, who gave verbal consent to proceed.  History of Present Illness   Dale Harrell is a 33 year old male who presents with a recent episode of hives and itching.  Hives and itching began on Thursday, initially on the chest, then spreading to the stomach and legs. The hives have resolved. He suspects a new collagen supplement and a new [employer] Sensitive body wash as potential causes. Hot showers provided temporary relief, and Benadryl offered relief for about four hours. He was later prescribed prednisone and continued with Zyrtec.  He stopped the collagen supplement two nights ago, and the itching subsided after discontinuation. Mild itching and redness on his back returned after stopping prednisone. He works night shifts, affecting his sleep, with disrupted sleep, heart palpitations, and potential sleep apnea symptoms. Currently, there are no hives, but mild itching and redness persist on his back.      Pt is asking for the sleep study, states that he is excessively sleepy at work, not getting restful sleep.  Pt also would like to check A1C today, has had problems in the past with blood sugar, last A1C is 2023 was 5.6.  Review of Systems  All other systems reviewed and are negative.       Objective:    BP 130/80   Pulse 75   Temp 98.1 F (36.7 C) (Oral)   Ht 5' 11.25 (1.81 m)   Wt (!) 338 lb  6.4 oz (153.5 kg)   SpO2 98%   BMI 46.87 kg/m    Physical Exam Vitals reviewed.  Constitutional:      Appearance: Normal appearance. He is morbidly obese.  Eyes:     Conjunctiva/sclera: Conjunctivae normal.  Pulmonary:     Effort: Pulmonary effort is normal.  Neurological:     Mental Status: He is alert.     No results found for any visits on 12/17/23.      Assessment & Plan:   Problem List Items Addressed This Visit       Unprioritized   Hyperglycemia   Relevant Orders   Hemoglobin A1c   Other Visit Diagnoses       Allergic reaction to drug, subsequent encounter    -  Primary   Relevant Orders   Ambulatory referral to Allergy      Daytime somnolence       Relevant Orders   Ambulatory referral to Sleep Studies     Assessment and Plan    Allergic reaction with hives likely due to collagen supplement Allergic reaction with hives likely due to collagen supplement, presenting with widespread hives and itching since Thursday. Initial treatment with Benadryl and prednisone provided relief. Hives resolved with cessation of collagen supplement. Differential includes reaction to [facility] Sensitive body wash, but systemic nature suggests ingestion-related cause. Antihistamines are safe for long-term use. - Continue daily antihistamine (Zyrtec or Claritin) for 5-7  days. - Consider stopping antihistamines after 5-7 days to assess for recurrence of hives. - Maintain hydration and use thick moisturizers to prevent skin irritation. - Referred to allergist for further evaluation and testing. - Upload photos of hives to MyChart for allergist review.  Hyperglycemia Previous A1c levels showing improvement from 6.4% in 2017 to 5.6% in 2022. Recent fasting blood sugar was 91 mg/dL. Skin darkening noted, possibly related to blood sugar levels. - Ordered A1c test today. - Will schedule follow-up blood work for February visit.  Somnolence Reports disrupted sleep and heart palpitations,  possibly related to night shift work. Discussed potential for sleep apnea and its implications for future cardiovascular health. Home sleep study preferred due to night shift schedule. CPAP machines are tolerated by 80% of users, while 20% may find them intolerable. Weight loss medication Zepbound is FDA approved for obstructive sleep apnea but may require insurance appeal for coverage. - Ordered home sleep study. - Discussed potential treatments for obstructive sleep apnea, including CPAP and weight loss medication.        No orders of the defined types were placed in this encounter.   Return in about 3 months (around 03/25/2024) for annual physical exam.  Heron CHRISTELLA Sharper, MD

## 2023-12-17 NOTE — Telephone Encounter (Signed)
 Appt today at 10:40am.

## 2024-03-25 ENCOUNTER — Ambulatory Visit: Admitting: Family Medicine
